# Patient Record
Sex: Female | Born: 1980 | Race: Black or African American | Hispanic: No | Marital: Single | Smoking: Never smoker
Health system: Southern US, Community
[De-identification: ages and names within clinical notes are randomized; demographics above are authoritative.]

## PROBLEM LIST (undated history)

## (undated) DIAGNOSIS — R000 Tachycardia, unspecified: Secondary | ICD-10-CM

## (undated) DIAGNOSIS — F1121 Opioid dependence, in remission: Secondary | ICD-10-CM

## (undated) DIAGNOSIS — F1120 Opioid dependence, uncomplicated: Secondary | ICD-10-CM

## (undated) DIAGNOSIS — F329 Major depressive disorder, single episode, unspecified: Secondary | ICD-10-CM

## (undated) DIAGNOSIS — R87629 Unspecified abnormal cytological findings in specimens from vagina: Secondary | ICD-10-CM

## (undated) DIAGNOSIS — F419 Anxiety disorder, unspecified: Secondary | ICD-10-CM

## (undated) DIAGNOSIS — L732 Hidradenitis suppurativa: Secondary | ICD-10-CM

## (undated) DIAGNOSIS — F32A Depression, unspecified: Secondary | ICD-10-CM

## (undated) DIAGNOSIS — D219 Benign neoplasm of connective and other soft tissue, unspecified: Secondary | ICD-10-CM

## (undated) HISTORY — DX: Hidradenitis suppurativa: L73.2

## (undated) HISTORY — PX: WISDOM TOOTH EXTRACTION: SHX21

## (undated) HISTORY — PX: HYDRADENITIS EXCISION: SHX5243

---

## 2000-07-21 ENCOUNTER — Other Ambulatory Visit: Admission: RE | Admit: 2000-07-21 | Discharge: 2000-07-21 | Payer: Self-pay | Admitting: Obstetrics and Gynecology

## 2000-10-22 ENCOUNTER — Other Ambulatory Visit: Admission: RE | Admit: 2000-10-22 | Discharge: 2000-10-22 | Payer: Self-pay | Admitting: Obstetrics and Gynecology

## 2000-11-23 ENCOUNTER — Inpatient Hospital Stay (HOSPITAL_COMMUNITY): Admission: AD | Admit: 2000-11-23 | Discharge: 2000-11-23 | Payer: Self-pay | Admitting: Obstetrics and Gynecology

## 2000-12-08 ENCOUNTER — Inpatient Hospital Stay (HOSPITAL_COMMUNITY): Admission: AD | Admit: 2000-12-08 | Discharge: 2000-12-10 | Payer: Self-pay | Admitting: Obstetrics and Gynecology

## 2003-03-10 ENCOUNTER — Other Ambulatory Visit: Admission: RE | Admit: 2003-03-10 | Discharge: 2003-03-10 | Payer: Self-pay | Admitting: Family Medicine

## 2007-04-13 ENCOUNTER — Emergency Department (HOSPITAL_COMMUNITY): Admission: EM | Admit: 2007-04-13 | Discharge: 2007-04-13 | Payer: Self-pay | Admitting: Emergency Medicine

## 2008-01-25 ENCOUNTER — Encounter (INDEPENDENT_AMBULATORY_CARE_PROVIDER_SITE_OTHER): Payer: Self-pay | Admitting: General Surgery

## 2008-01-25 ENCOUNTER — Ambulatory Visit (HOSPITAL_BASED_OUTPATIENT_CLINIC_OR_DEPARTMENT_OTHER): Admission: RE | Admit: 2008-01-25 | Discharge: 2008-01-25 | Payer: Self-pay | Admitting: General Surgery

## 2010-06-19 NOTE — Op Note (Signed)
NAMEYITTA, GONGAWARE              ACCOUNT NO.:  [id number]   MEDICAL RECORD NO.:  [id number]          PATIENT TYPE:  AMB   LOCATION:  [facility]                         FACILITY:  [facility]   PHYSICIAN:  Anselm Pancoast. Weatherly, M.D.DATE OF BIRTH:  Mar 24, 1980   DATE OF PROCEDURE:  01/25/2008  DATE OF DISCHARGE:                               OPERATIVE REPORT   PREOPERATIVE DIAGNOSES:  Chronic recurrent hidradenitis, right axilla.  History of previous wide excision of hidradenitis, left axilla.   HISTORY:  Kristen David is a 30 year old female that I saw in the  office in December.  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 Allyne Gee, M.D. gave  her my name. [name]. Shon Hough, M.D. in 2004 did a wide excision of  the hidradenitis in the left axilla.  Leonie Man, M.D. in our office  had done her mother's hidradenitis and Dr. Shon Hough had re-excised the  area, so it appears there is a significant family history of this.  She  is in school and elected at this time so that hopefully she can get this  done during the Christmas holidays and she has been on doxycycline  recently.  On examination now there is one area that is a chronic  draining sinus going down the inner aspect of the lateral right axilla  and then there is this chronic area of hidradenitis in the axilla, but  there is not any obvious drainage clinically at this time.   Preoperatively, I gave her a gram of Ancef, marked the right side and  then took her back to the operating room, positioned her so that the  right arm was at about 90 degrees and then placed a towel up under her  back to kind of elevate since it goes quite far laterally.  I then made  a skin mark to excise all of the various areas and then starting at the  superior aspect of the inferior flap, elevated the skin with skin hooks  and later switched to the double prong breast hook for exposure and this  was a large area of excision of the area.  Numerous vessels were clamped  and  tied with 4-0 Vicryl.  Good hemostasis was obtained and in the  lateral __________ to the arm, the incision was made by a little S-shape  to include that area which is the more active acute changes right at  this time.  All of the superior of the skin flap excised was probably an  inch and a half in width and undermining both superiorly and inferiorly.  As far as I put a 10 Jackson-Pratt drain, it was flat and then used a  few 4-0 Vicryl stitches subcuticular and then a few 3-0 nylon to suture  the drain also at the more tension areas of the incision and then used  simple 4-0 nylon and then used skin staples also.  The patient tolerated  the procedure nicely.  I did not put any local anesthesia in and I am  going to switch her to Septra.   I did culture the wounds on the back table, aerobic  and anaerobic and if  we need to make a switch of her antibiotics we will when the results  return.  The patient will be released after a short time.  Her mother  knows how to do the [place] drains, who she will be staying with.  I will  plan on seeing her in the office early next week, remove the actual  staples.  The actual skin stitches I am going to leave in about 10 days  or 2 weeks.           ______________________________  Anselm Pancoast. Zachery Dakins, M.D.    WJW/MEDQ  D:  01/25/2008  T:  01/25/2008  Job:  [id number]   cc:   Candyce Churn. Allyne Gee, M.D.  Fax: [phone]

## 2010-06-22 NOTE — H&P (Signed)
[facility]  Patient:    Kristen David, Kristen David Visit Number: [id number] MRN: [id number]          Service Type: OBS Location: 910B 9160 01 Attending Physician:  Leonard Schwartz Dictated by:   Saverio Danker, C.N.M. Admit Date:  12/08/2000                           History and Physical  CHIEF COMPLAINT:              Ms. Desch is a 30 year old single black female, gravida 2 para 0, 0-1-0, at 38-1/[redacted] weeks gestation, who presents complaining of uterine contractions every two to three minutes for the last several hours.  HISTORY OF PRESENT ILLNESS:   She denies any leaking or vaginal bleeding while at home.  She denies any headache or visual disturbances.  She does report some nausea but no vomiting until just now when she arrived in maternity admissions.  She does report positive fetal movement.  Her pregnancy has been followed at [facility] by the Certified Nurse Midwife service and has been essentially uncomplicated, though at risk for                               1. History of conception on oral contraceptives.                               2. First trimester spotting.                               3. Abnormal Pap smear.                                Her group B strep is negative.  She requests an epidural for labor.  PAST OB/GYN HISTORY:          She is gravida 2 para 0, 0-1-0, and had an elective AB in November 2001 without any complications.  She was taking Ortho Tri-Cyclen when she conceived but had missed some pills at that time.  ALLERGIES:                    No known drug allergies.  PAST MEDICAL HISTORY:         She reports having had the usual childhood diseases.  She has no medical problems.  PAST SURGICAL HISTORY:        Her only surgery was that her wisdom teeth were removed in November of 2000.  FAMILY HISTORY:               Surgery for multiple relatives with type 2 diabetes.  Maternal grandfather with  kidney failure secondary to diabetes. Maternal grandfather with prostate cancer, and maternal grandmother with stroke.  GENETIC HISTORY:              Negative.  SOCIAL HISTORY:               She is single.  She is employed part-time.  She lives with her mother, who is supportive.  The father of the baby is also supportive.  They deny any illicit drug use, alcohol or smoking with this pregnancy.  They are of the [employer] faith.  PRENATAL LABORATORY DATA:     Blood type B-positive.  Antibody screen negative.  Sickle cell trait negative.  Syphilis nonreactive.  Rubella immune. Hepatitis B surface antigen negative.  HIV nonreactive.  GC and Chlamydia both negative.  Maternal serum alpha fetoprotein was within normal limits.  One hour glucola was also within normal limits.  Her 36 week beta strep was negative.  PHYSICAL EXAMINATION:  VITAL SIGNS:                  Stable.  She is afebrile.  HEENT:                        Grossly within normal limits.  HEART:                        Regular rhythm and rate.  CHEST:                        Clear.  BREAST:                       Soft, nontender.  ABDOMEN:                      Gravid, with uterine contractions every two to three minutes.  Fetal heart rate reactive and reassuring.  PELVIC:                       Cervix 4 cm, 95% effaced, vertex at 0 station, with tight bulging membranes noted.  EXTREMITIES:                  Within normal limits.  ASSESSMENT:                   1. Intrauterine pregnancy at term.                               2. Active labor.                               3. Desires epidural for labor.                               4. Negative group B streptococci.  PLAN:                         1. Admit to labor and delivery to follow routine                                  CNM orders.                               2. Place an epidural.                               3. Notify Dr. Jaymes Graff of patients  admission. Dictated by:   Vance Gather Duplantis, C.N.M. Attending Physician:  Leonard Schwartz DD:  12/08/00 TD:  12/08/00 Job: [id number]

## 2010-10-29 LAB — CULTURE, ROUTINE-ABSCESS

## 2010-11-09 LAB — ANAEROBIC CULTURE

## 2010-11-09 LAB — WOUND CULTURE

## 2011-03-08 ENCOUNTER — Encounter (HOSPITAL_COMMUNITY): Payer: Self-pay | Admitting: *Deleted

## 2011-03-08 ENCOUNTER — Emergency Department (HOSPITAL_COMMUNITY)
Admission: EM | Admit: 2011-03-08 | Discharge: 2011-03-08 | Disposition: A | Discharge status: Home / Self Care | Payer: Self-pay | Attending: Emergency Medicine | Admitting: Emergency Medicine

## 2011-03-08 DIAGNOSIS — L02214 Cutaneous abscess of groin: Secondary | ICD-10-CM

## 2011-03-08 DIAGNOSIS — N764 Abscess of vulva: Secondary | ICD-10-CM | POA: Insufficient documentation

## 2011-03-08 DIAGNOSIS — L02219 Cutaneous abscess of trunk, unspecified: Secondary | ICD-10-CM | POA: Insufficient documentation

## 2011-03-08 MED ORDER — DOXYCYCLINE HYCLATE 100 MG PO CAPS: 100.0000 mg | ORAL_CAPSULE | Freq: Two times a day (BID) | ORAL | Status: AC

## 2011-03-08 MED ORDER — HYDROCODONE-ACETAMINOPHEN 5-325 MG PO TABS: ORAL_TABLET | ORAL | Status: AC

## 2011-03-08 NOTE — ED Notes (Signed)
Pt reports she has had cyst for several weeks. Pt reports this is a chronic issue. Pt reports she had an episode similar episodes under her arms, breasts, thighs and groin.  Pt has not been on antibiotics recently.

## 2011-03-08 NOTE — ED Provider Notes (Signed)
History     CSN: [id number]  Arrival date & time 03/08/11  1850   First MD Initiated Contact with Patient 03/08/11 1915      Chief Complaint  Patient presents with  . Cyst    (Consider location/radiation/quality/duration/timing/severity/associated sxs/prior treatment) HPI Comments: Patient with history of multiple recurrent MRSA boils -- presents with one week of recurrent boils in her left groin. Patient states some drainage from these areas. No fever. Patient has had previous surgery on bilateral axillas. Several of these areas have been draining. They are tender. Pain is not relieved with ibuprofen.  Patient is a 31 y.o. female presenting with abscess. The history is provided by the patient.  Abscess  This is a recurrent problem. The current episode started less than one week ago. The onset was gradual. The abscess is present on the groin. The problem is moderate. Pertinent negatives include no fever and no vomiting.    History reviewed. No pertinent past medical history.  History reviewed. No pertinent past surgical history.  No family history on file.  History  Substance Use Topics  . Smoking status: Never Smoker   . Smokeless tobacco: Not on file  . Alcohol Use: Yes     social drinking    OB History    Grav Para Term Preterm Abortions TAB SAB Ect Mult Living                  Review of Systems  Constitutional: Negative for fever.  Gastrointestinal: Negative for nausea and vomiting.  Skin: Negative for color change.       Positive for abscess.  Hematological: Negative for adenopathy.    Allergies  Review of patient's allergies indicates no known allergies.  Home Medications   Current Outpatient Rx  Name Route Sig Dispense Refill  . IBUPROFEN 200 MG PO TABS Oral Take 400 mg by mouth every 6 (six) hours as needed. PAIN      BP 139/83  Pulse 84  Temp(Src) 98.6 F (37 C) (Oral)  Resp 16  SpO2 100%  LMP 02/09/2011  Physical Exam  Nursing note and  vitals reviewed. Constitutional: She is oriented to person, place, and time. She appears well-developed and well-nourished.  HENT:  Head: Normocephalic and atraumatic.  Eyes: Conjunctivae are normal.  Neck: Normal range of motion. Neck supple.  Pulmonary/Chest: No respiratory distress.  Genitourinary:       Several small abscesses noted L groin and labia. One is actively draining and is associated with 1cm area of flucuence. Others are smaller, tender but are not associated with significant fluctuance. No signs of cellulitis.    Neurological: She is alert and oriented to person, place, and time.  Skin: Skin is warm and dry.  Psychiatric: She has a normal mood and affect.    ED Course  Procedures (including critical care time)  Labs Reviewed - No data to display No results found.   1. Abscess of groin    12:57 AM Patient seen and examined.   Vital signs reviewed and are as follows: Filed Vitals:   03/08/11 1919  BP: 139/83  Pulse: 84  Temp: 98.6 F (37 C)  Resp: 16   Patient examined with nurse chaperone at bedside.   Patient counseled on use of antibiotics and OTC treatments.   Urged to return with worsening, fever.    MDM  Patient with skin absceses, none which are amenable to incision and drainage at this time.  Will give abx. No signs of  cellulitis is surrounding skin.  Will d/c to home.          Eustace Moore [place], [place] 03/09/11 [phone]

## 2011-03-08 NOTE — ED Notes (Signed)
Pt has several small superficial cyst to inner thighs and groin area. Most are draining. No major areas of inflammation or infection noted.

## 2011-03-08 NOTE — ED Notes (Signed)
Pt reports she has chronic boils since she was a teenager. Pt has been taking bactrim for infections. The boil went away and then three weeks ago got another boil which has gotten larger and spread to more. Pt has boil to right thigh, pubic bone and also left labia.

## 2011-03-09 NOTE — ED Provider Notes (Signed)
Medical screening examination/treatment/procedure(s) were performed by non-physician practitioner and as supervising physician I was immediately available for consultation/collaboration. Jahmeer Porche Y.   Tenishia Ekman Y. Chevez Sambrano, MD 03/09/11 2259 

## 2011-12-02 ENCOUNTER — Encounter (HOSPITAL_COMMUNITY): Payer: Self-pay | Admitting: *Deleted

## 2011-12-02 ENCOUNTER — Emergency Department (HOSPITAL_COMMUNITY)
Admission: EM | Admit: 2011-12-02 | Discharge: 2011-12-02 | Disposition: A | Discharge status: Home / Self Care | Payer: Self-pay | Attending: Emergency Medicine | Admitting: Emergency Medicine

## 2011-12-02 DIAGNOSIS — L02419 Cutaneous abscess of limb, unspecified: Secondary | ICD-10-CM | POA: Insufficient documentation

## 2011-12-02 DIAGNOSIS — L732 Hidradenitis suppurativa: Secondary | ICD-10-CM | POA: Insufficient documentation

## 2011-12-02 DIAGNOSIS — L0291 Cutaneous abscess, unspecified: Secondary | ICD-10-CM

## 2011-12-02 MED ORDER — CEPHALEXIN 500 MG PO CAPS: 500.0000 mg | ORAL_CAPSULE | Freq: Four times a day (QID) | ORAL | Status: DC

## 2011-12-02 MED ORDER — OXYCODONE-ACETAMINOPHEN 5-325 MG PO TABS: 1.0000 | ORAL_TABLET | Freq: Four times a day (QID) | ORAL | Status: DC | PRN

## 2011-12-02 MED ORDER — SULFAMETHOXAZOLE-TRIMETHOPRIM 800-160 MG PO TABS: 1.0000 | ORAL_TABLET | Freq: Two times a day (BID) | ORAL | Status: DC

## 2011-12-02 MED ORDER — KETOROLAC TROMETHAMINE 60 MG/2ML IM SOLN
60.0000 mg | Freq: Once | INTRAMUSCULAR | Status: AC
  Administered 2011-12-02: 60 mg via INTRAMUSCULAR
  Filled 2011-12-02: qty 2

## 2011-12-02 NOTE — ED Notes (Signed)
Pt present with a boil that been reoccurring for months.  Right inner thigh.  Pt reports minmal drainage.  Pt report soiling clothes with ruptures.  Pt denies n/v chills or fever.

## 2011-12-02 NOTE — ED Notes (Signed)
Rx given x3 Pt ambulating independently w/ steady gait on d/c in no acute distress, A&Ox4. D/c instructions reviewed w/ pt - pt denies any further questions or concerns at present.   

## 2011-12-02 NOTE — ED Provider Notes (Signed)
History     CSN: [id number]  Arrival date & time 12/02/11  2123   First MD Initiated Contact with Patient 12/02/11 2128      Chief Complaint  Patient presents with  . Recurrent Skin Infections    (Consider location/radiation/quality/duration/timing/severity/associated sxs/prior treatment) HPI Comments: Patient with history of hidradenitis and bilateral axillary surgery presents with complaint of abscess. Patietn has had numerous abscesses. She states that this abscess in on her right inner thigh and extremely painful. She rates the pain 10/10 and worse with wearing underwear. She has used warm compresses and has elicited some pus, but states the area will not heal. Denies fever or chills.   The history is provided by the patient. No language interpreter was used.    No past medical history on file.  No past surgical history on file.  No family history on file.  History  Substance Use Topics  . Smoking status: Never Smoker   . Smokeless tobacco: Not on file  . Alcohol Use: Yes     social drinking    OB History    Grav Para Term Preterm Abortions TAB SAB Ect Mult Living                  Review of Systems  Constitutional: Negative for fever and chills.  Skin: Positive for wound.    Allergies  Review of patient's allergies indicates no known allergies.  Home Medications   Current Outpatient Rx  Name Route Sig Dispense Refill  . IBUPROFEN 200 MG PO TABS Oral Take 400 mg by mouth every 6 (six) hours as needed. PAIN      BP 123/65  Pulse 90  Temp 98.2 F (36.8 C) (Oral)  Resp 14  SpO2 100%  Physical Exam  Nursing note and vitals reviewed. Constitutional: She appears well-developed and well-nourished. No distress.  HENT:  Head: Normocephalic and atraumatic.  Mouth/Throat: Oropharynx is clear and moist.  Eyes: Conjunctivae normal are normal. No scleral icterus.  Neck: Normal range of motion. Neck supple.  Cardiovascular: Normal rate, regular rhythm and  normal heart sounds.   Pulmonary/Chest: Effort normal and breath sounds normal.  Abdominal: Soft. Bowel sounds are normal. There is no tenderness.  Neurological: She is alert.  Skin: Skin is warm and dry.       ED Course  Procedures (including critical care time)  Labs Reviewed - No data to display No results found.   1. Abscess   2. Hidradenitis suppurativa       MDM  Patient presented with complaint of abscess. Area open but no fluctuance or residual pus that requires I & D. Patient given Toradol in ED with improvement. Discharged on bactrim, keflex, and given referral to general surgery. Return precautions given.        Pixie Casino, PA-C 12/03/11 0014

## 2011-12-03 NOTE — ED Provider Notes (Signed)
Medical screening examination/treatment/procedure(s) were performed by non-physician practitioner and as supervising physician I was immediately available for consultation/collaboration.   Loren Racer, MD 12/03/11 1505

## 2012-05-21 ENCOUNTER — Encounter (INDEPENDENT_AMBULATORY_CARE_PROVIDER_SITE_OTHER): Payer: Self-pay | Admitting: General Surgery

## 2012-05-21 ENCOUNTER — Ambulatory Visit (INDEPENDENT_AMBULATORY_CARE_PROVIDER_SITE_OTHER): Payer: Medicaid Other | Admitting: General Surgery

## 2012-05-21 VITALS — BP 122/78 | HR 88 | Temp 98.3°F | Resp 20 | Ht 60.0 in | Wt 169.0 lb

## 2012-05-21 DIAGNOSIS — L732 Hidradenitis suppurativa: Secondary | ICD-10-CM

## 2012-05-21 NOTE — Patient Instructions (Signed)
Clean area with Hibiclens soap daily. Applied dry dressing to area daily. Try not to touch the area with your hands.

## 2012-05-21 NOTE — Progress Notes (Signed)
Patient ID: Kristen David, female   DOB: December 24, 1980, 33 y.o.   MRN: [id number]  Chief Complaint  Patient presents with  . Follow-up    L groin abscess    HPI Kristen David is a 32 y.o. female.   HPI  She is referred by Luster Landsberg, ANP for evaluation of a left groin abscess.  She has a long history of hidradenitis suppurativa. She's had procedures done and the axillary areas. She developed an area of drainage in her left groin and there is concern that she may need an incision and drainage. She was started on doxycycline 2 days ago.  History reviewed. No pertinent past medical history.  History reviewed. No pertinent past surgical history.  Family History  Problem Relation Age of Onset  . Diabetes Mother   . Cancer Mother     Breast    Social History History  Substance Use Topics  . Smoking status: Never Smoker   . Smokeless tobacco: Never Used  . Alcohol Use: Yes     Comment: social drinking    No Known Allergies  Current Outpatient Prescriptions  Medication Sig Dispense Refill  . doxycycline (DORYX) 100 MG EC tablet Take 100 mg by mouth 2 (two) times daily.       No current facility-administered medications for this visit.    Review of Systems Review of Systems  Constitutional: Negative for fever and chills.    Blood pressure 122/78, pulse 88, temperature 98.3 F (36.8 C), temperature source Temporal, resp. rate 20, height 5' (1.524 m), weight 169 lb (76.658 kg).  Physical Exam Physical Exam  Constitutional: No distress.  Overweight female.  Genitourinary:  1 cm open wound in the left groin. No purulence or significant erythema. Some shotty adenopathy proximal to this. Multiple scars in both groins.    Data Reviewed Notes from [facility]  Assessment    Left groin hidradenitis suppurativa. No abscess that needs to be drained.     Plan    Clean area with Hibiclens soap daily. Applied dry dressing to the area. Continue  antibiotics. Return visit as needed.        Lolamae Voisin J 05/21/2012, 5:10 PM

## 2012-05-29 ENCOUNTER — Encounter (INDEPENDENT_AMBULATORY_CARE_PROVIDER_SITE_OTHER): Payer: Self-pay

## 2012-06-15 ENCOUNTER — Telehealth: Payer: Self-pay | Admitting: *Deleted

## 2012-06-15 ENCOUNTER — Ambulatory Visit: Payer: Self-pay | Admitting: Internal Medicine

## 2012-06-15 NOTE — Telephone Encounter (Signed)
Pt did not come for referral appt.  Left message requesting pt call [facility] for a new referral appt.

## 2012-09-17 ENCOUNTER — Emergency Department (HOSPITAL_COMMUNITY)
Admission: EM | Admit: 2012-09-17 | Discharge: 2012-09-17 | Disposition: A | Discharge status: Home / Self Care | Payer: Medicaid Other | Attending: Emergency Medicine | Admitting: Emergency Medicine

## 2012-09-17 ENCOUNTER — Encounter (HOSPITAL_COMMUNITY): Payer: Self-pay | Admitting: Emergency Medicine

## 2012-09-17 DIAGNOSIS — Z79899 Other long term (current) drug therapy: Secondary | ICD-10-CM | POA: Insufficient documentation

## 2012-09-17 DIAGNOSIS — L732 Hidradenitis suppurativa: Secondary | ICD-10-CM | POA: Insufficient documentation

## 2012-09-17 DIAGNOSIS — Z8614 Personal history of Methicillin resistant Staphylococcus aureus infection: Secondary | ICD-10-CM | POA: Insufficient documentation

## 2012-09-17 DIAGNOSIS — L539 Erythematous condition, unspecified: Secondary | ICD-10-CM | POA: Insufficient documentation

## 2012-09-17 MED ORDER — SULFAMETHOXAZOLE-TRIMETHOPRIM 800-160 MG PO TABS: 1.0000 | ORAL_TABLET | Freq: Two times a day (BID) | ORAL | Status: AC

## 2012-09-17 MED ORDER — CEPHALEXIN 500 MG PO CAPS: 500.0000 mg | ORAL_CAPSULE | Freq: Four times a day (QID) | ORAL | Status: DC

## 2012-09-17 MED ORDER — HYDROCODONE-ACETAMINOPHEN 5-325 MG PO TABS: 1.0000 | ORAL_TABLET | ORAL | Status: DC | PRN

## 2012-09-17 MED ORDER — IBUPROFEN 800 MG PO TABS
800.0000 mg | ORAL_TABLET | Freq: Once | ORAL | Status: AC
  Administered 2012-09-17: 800 mg via ORAL
  Filled 2012-09-17: qty 1

## 2012-09-17 NOTE — ED Notes (Signed)
Pt c/o abscesses on buttocks and thighs x2 weeks that have ruptured.  Reports hx of abscesses.

## 2012-09-17 NOTE — ED Provider Notes (Signed)
CSN: [id number]     Arrival date & time 09/17/12  1118 History     First MD Initiated Contact with Patient 09/17/12 1129     Chief Complaint  Patient presents with  . Abscess   (Consider location/radiation/quality/duration/timing/severity/associated sxs/prior Treatment) HPI Comments: Patient is a 32 year old female with a history of hidradenitis suppurativa who presents for abscesses to her right and left inguinal region. Abscesses have been present for 2 weeks and have been associated with pain. Pain worse x 2 days as patient states abscesses ruptured with use of frequent hot compresses and warm soaks. Pain aggravated with palpation. She states abscesses have all been actively draining x 2 days. Patient denies fevers, red linear streaking, numbness/tingling, dysuria, and rectal pain or pain with defecation. She is followed by [facility] and has hand operations on both axilla, but was told she is not a candidate for additional procedures at this time. PCP - Dr. Allyne Gee. Endorses hx of MRSA.  Patient is a 32 y.o. female presenting with abscess. The history is provided by the patient. No language interpreter was used.  Abscess Associated symptoms: no fever     Past Medical History  Diagnosis Date  . Hidradenitis suppurativa    History reviewed. No pertinent past surgical history. Family History  Problem Relation Age of Onset  . Diabetes Mother   . Cancer Mother     Breast   History  Substance Use Topics  . Smoking status: Never Smoker   . Smokeless tobacco: Never Used  . Alcohol Use: Yes     Comment: social drinking   OB History   Grav Para Term Preterm Abortions TAB SAB Ect Mult Living                 Review of Systems  Constitutional: Negative for fever.  Gastrointestinal: Negative for rectal pain.  Genitourinary: Negative for dysuria and vaginal pain.  Skin: Positive for color change.       +abscesses  Neurological: Negative for weakness and numbness.    All other systems reviewed and are negative.   Allergies  Review of patient's allergies indicates no known allergies.  Home Medications   Current Outpatient Rx  Name  Route  Sig  Dispense  Refill  . citalopram (CELEXA) 20 MG tablet   Oral   Take 20 mg by mouth daily.         Marland Kitchen ibuprofen (ADVIL,MOTRIN) 200 MG tablet   Oral   Take 400 mg by mouth every 6 (six) hours as needed for pain.         . Multiple Vitamin (MULTIVITAMIN WITH MINERALS) TABS tablet   Oral   Take 1 tablet by mouth daily.         . traZODone (DESYREL) 50 MG tablet   Oral   Take 50 mg by mouth at bedtime.         . Vitamins A & D (VITAMIN A & D) ointment   Topical   Apply 1 application topically daily as needed for dry skin.         . cephALEXin (KEFLEX) 500 MG capsule   Oral   Take 1 capsule (500 mg total) by mouth 4 (four) times daily.   40 capsule   0   . HYDROcodone-acetaminophen (NORCO/VICODIN) 5-325 MG per tablet   Oral   Take 1 tablet by mouth every 4 (four) hours as needed for pain.   18 tablet   0   . sulfamethoxazole-trimethoprim (BACTRIM  DS,SEPTRA DS) 800-160 MG per tablet   Oral   Take 1 tablet by mouth 2 (two) times daily.   20 tablet   0    BP 116/78  Pulse 105  Temp(Src) 98.1 F (36.7 C)  SpO2 99% Physical Exam  Nursing note and vitals reviewed. Constitutional: She is oriented to person, place, and time. She appears well-developed and well-nourished. No distress.  HENT:  Head: Normocephalic and atraumatic.  Eyes: Conjunctivae and EOM are normal. No scleral icterus.  Neck: Normal range of motion.  Cardiovascular: Normal rate, regular rhythm and intact distal pulses.   Distal pulses 2+ b/l  Pulmonary/Chest: Effort normal. No respiratory distress.  Musculoskeletal: Normal range of motion.       Legs: Neurological: She is alert and oriented to person, place, and time.  Skin: Skin is warm and dry. No rash noted. She is not diaphoretic. There is erythema. No  pallor.  Patient with 4 abscesses in inguinal region; 2 on left side and 2 on right side. Areas fluctuant and exquisitely TTP; mild induration in L inguinal region. No active drainage appreciated. No red linear streaking.  Psychiatric: She has a normal mood and affect. Her behavior is normal.   ED Course   Procedures (including critical care time)  Labs Reviewed - No data to display No results found.  1. Hidradenitis suppurativa-groin    MDM  4 uncomplicated abscesses in patient with hx of MRSA and hidradenitis suppurativa. Patient endorses that abscesses actively draining x 2 days. Presents, primarily for pain control, as pain has worsened since abscesses ruptured. Patient neurovascularly intact. No complicating features, fever, or red linear streaking. U/S done at bedside significant for superficial abscesses of R and L groin. Have discussed drainage at bedside in ED which she declines at this time; reliable for follow up with general surgery and her PCP. Patient appropriate for d/c with Rx for Bactrim and Keflex as well as Norco for pain control. Indications for ED return discussed and patient agreeable to plan.        Antony Madura, PA-C 09/17/12 1222  Antony Madura, PA-C 09/18/12 2155

## 2012-09-19 NOTE — ED Provider Notes (Signed)
Medical screening examination/treatment/procedure(s) were conducted as a shared visit with non-physician practitioner(s) and myself.  I personally evaluated the patient during the encounter.  Pt with hidradenitis suppurativa. Self draining.   Derwood Kaplan, MD 09/19/12 [phone]

## 2013-05-17 ENCOUNTER — Emergency Department (HOSPITAL_COMMUNITY)
Admission: EM | Admit: 2013-05-17 | Discharge: 2013-05-17 | Disposition: A | Discharge status: Home / Self Care | Payer: Medicaid Other | Attending: Emergency Medicine | Admitting: Emergency Medicine

## 2013-05-17 ENCOUNTER — Encounter (HOSPITAL_COMMUNITY): Payer: Self-pay | Admitting: Emergency Medicine

## 2013-05-17 DIAGNOSIS — L02219 Cutaneous abscess of trunk, unspecified: Secondary | ICD-10-CM | POA: Insufficient documentation

## 2013-05-17 DIAGNOSIS — Z79899 Other long term (current) drug therapy: Secondary | ICD-10-CM | POA: Insufficient documentation

## 2013-05-17 DIAGNOSIS — L03319 Cellulitis of trunk, unspecified: Secondary | ICD-10-CM

## 2013-05-17 DIAGNOSIS — Z792 Long term (current) use of antibiotics: Secondary | ICD-10-CM | POA: Insufficient documentation

## 2013-05-17 DIAGNOSIS — L732 Hidradenitis suppurativa: Secondary | ICD-10-CM | POA: Insufficient documentation

## 2013-05-17 DIAGNOSIS — L02214 Cutaneous abscess of groin: Secondary | ICD-10-CM

## 2013-05-17 MED ORDER — HYDROCODONE-ACETAMINOPHEN 5-325 MG PO TABS: 1.0000 | ORAL_TABLET | ORAL | Status: DC | PRN

## 2013-05-17 MED ORDER — SULFAMETHOXAZOLE-TRIMETHOPRIM 800-160 MG PO TABS: 1.0000 | ORAL_TABLET | Freq: Two times a day (BID) | ORAL | Status: AC

## 2013-05-17 MED ORDER — CEPHALEXIN 500 MG PO CAPS: 500.0000 mg | ORAL_CAPSULE | Freq: Four times a day (QID) | ORAL | Status: DC

## 2013-05-17 NOTE — ED Provider Notes (Signed)
CSN: [id number]     Arrival date & time 05/17/13  2153 History   None    This chart was scribed for non-physician practitioner, Antonietta Breach, PA-C working with Merryl Hacker, MD by Forrestine Him, ED Scribe. This patient was seen in room WTR6/WTR6 and the patient's care was started at 11:13 PM.   Chief Complaint  Patient presents with  . Recurrent Skin Infections   HPI  HPI Comments: Kristen David is a 33 y.o. Female with a PMHx of hidradenitis suppurativa who presents to the Emergency Department complaining of an abscess to R groin x 3 weeks. Pt states she has tried heat and soaks to the area with mild improvement. States the area opened about 1 week ago, however, she is still experiencing worsening pain to the area. Pt states she has experienced multiple abscesses and says they always appear in the same region. States she has consulted with a Psychologist, sport and exercise at [place] but was advised to not move forward with surgery for her skin infection. At this time she denies any fever, pain with bowel movement, vaginal pain, vaginal discharge, hematuria, or chills. Pt states these infections have resolved with antibiotics in the past. No other concerns this visit.   Past Medical History  Diagnosis Date  . Hidradenitis suppurativa    History reviewed. No pertinent past surgical history. Family History  Problem Relation Age of Onset  . Diabetes Mother   . Cancer Mother     Breast   History  Substance Use Topics  . Smoking status: Never Smoker   . Smokeless tobacco: Never Used  . Alcohol Use: Yes     Comment: social drinking   OB History   Grav Para Term Preterm Abortions TAB SAB Ect Mult Living                  Review of Systems  Constitutional: Negative for fever and chills.  HENT: Negative for congestion.   Eyes: Negative for redness.  Respiratory: Negative for cough.   Genitourinary: Negative for hematuria, vaginal discharge and vaginal pain.  Skin: Positive for wound  (Abscess to R goin). Negative for rash.  Psychiatric/Behavioral: Negative for confusion.     Allergies  Review of patient's allergies indicates no known allergies.  Home Medications   Current Outpatient Rx  Name  Route  Sig  Dispense  Refill  . cephALEXin (KEFLEX) 500 MG capsule   Oral   Take 1 capsule (500 mg total) by mouth 4 (four) times daily.   40 capsule   0   . citalopram (CELEXA) 20 MG tablet   Oral   Take 20 mg by mouth daily.         Marland Kitchen HYDROcodone-acetaminophen (NORCO/VICODIN) 5-325 MG per tablet   Oral   Take 1 tablet by mouth every 4 (four) hours as needed.   15 tablet   0   . ibuprofen (ADVIL,MOTRIN) 200 MG tablet   Oral   Take 400 mg by mouth every 6 (six) hours as needed for pain.         . Multiple Vitamin (MULTIVITAMIN WITH MINERALS) TABS tablet   Oral   Take 1 tablet by mouth daily.         Marland Kitchen sulfamethoxazole-trimethoprim (BACTRIM DS,SEPTRA DS) 800-160 MG per tablet   Oral   Take 1 tablet by mouth 2 (two) times daily.   20 tablet   0   . traZODone (DESYREL) 50 MG tablet   Oral   Take  50 mg by mouth at bedtime.         . Vitamins A & D (VITAMIN A & D) ointment   Topical   Apply 1 application topically daily as needed for dry skin.          Triage Vitals: BP 127/73  Pulse 79  Temp(Src) 98.5 F (36.9 C) (Oral)  Resp 14  SpO2 98%  LMP 05/04/2013   Physical Exam  Nursing note and vitals reviewed. Constitutional: She is oriented to person, place, and time. She appears well-developed and well-nourished.  HENT:  Head: Normocephalic and atraumatic.  Eyes: EOM are normal.  Neck: Normal range of motion.  Cardiovascular: Normal rate, regular rhythm and intact distal pulses.   Distal radial pulse 2+ in left upper extremity  Pulmonary/Chest: Effort normal.  Abdominal: Soft. There is no tenderness. There is no rebound and no guarding.  Genitourinary:    There is tenderness on the right labia. There is no rash on the right labia.  There is no rash, tenderness or lesion on the left labia. No vaginal discharge found.  Musculoskeletal: Normal range of motion.  Neurological: She is alert and oriented to person, place, and time.  Skin: Skin is warm and dry.  Psychiatric: She has a normal mood and affect. Her behavior is normal.    ED Course  Procedures (including critical care time)  DIAGNOSTIC STUDIES: Oxygen Saturation is 98% on RA, Normal by my interpretation.    COORDINATION OF CARE: 11:23 PM- Will prescribe antibiotics at discharge. Advised pt to return in 48 hours for a recheck. Discussed treatment plan with pt at bedside and pt agreed to plan.     Labs Review Labs Reviewed - No data to display Imaging Review No results found.   EKG Interpretation None      MDM   Final diagnoses:  Hidradenitis suppurativa  Abscess of groin, right    Patient with skin abscess to R groin c/w hidradenitis suppurativa. Patient declines I&D in ED as she states this is a recurring problem and the pain from trying to drain the area is "worse than childbirth".  Have stressed wound recheck in 2 days and f/u with her general surgeon. Encouraged home warm soaks and compresses. Mild signs of cellulitis is surrounding skin. Will d/c to home with Bactrim and Keflex. Return precautions discussed and patient agreeable to plan with no unaddressed concerns.  I personally performed the services described in this documentation, which was scribed in my presence. The recorded information has been reviewed and is accurate.   Filed Vitals:   05/17/13 2210  BP: 127/73  Pulse: 79  Temp: 98.5 F (36.9 C)  TempSrc: Oral  Resp: 14  SpO2: 98%     Antonietta Breach, PA-C 05/18/13 0012

## 2013-05-17 NOTE — ED Notes (Signed)
Patient reports that she has abscess in her right groin.

## 2013-05-17 NOTE — Discharge Instructions (Signed)
Abscess An abscess (boil or furuncle) is an infected area on or under the skin. This area is filled with yellowish-white fluid (pus) and other material (debris). HOME CARE   Only take medicines as told by your doctor.  If you were given antibiotic medicine, take it as directed. Finish the medicine even if you start to feel better.  If gauze is used, follow your doctor's directions for changing the gauze.  To avoid spreading the infection:  Keep your abscess covered with a bandage.  Wash your hands well.  Do not share personal care items, towels, or whirlpools with others.  Avoid skin contact with others.  Keep your skin and clothes clean around the abscess.  Keep all doctor visits as told. GET HELP RIGHT AWAY IF:   You have more pain, puffiness (swelling), or redness in the wound site.  You have more fluid or blood coming from the wound site.  You have muscle aches, chills, or you feel sick.  You have a fever. MAKE SURE YOU:   Understand these instructions.  Will watch your condition.  Will get help right away if you are not doing well or get worse. Document Released: 07/10/2007 Document Revised: 07/23/2011 Document Reviewed: 04/05/2011 [facility] Patient Information 2014 [place].  Abscess Care After An abscess (also called a boil or furuncle) is an infected area that contains a collection of pus. Signs and symptoms of an abscess include pain, tenderness, redness, or hardness, or you may feel a moveable soft area under your skin. An abscess can occur anywhere in the body. The infection may spread to surrounding tissues causing cellulitis. A cut (incision) by the surgeon was made over your abscess and the pus was drained out. Gauze may have been packed into the space to provide a drain that will allow the cavity to heal from the inside outwards. The boil may be painful for 5 to 7 days. Most people with a boil do not have high fevers. Your abscess, if seen early, may  not have localized, and may not have been lanced. If not, another appointment may be required for this if it does not get better on its own or with medications. HOME CARE INSTRUCTIONS   Only take over-the-counter or prescription medicines for pain, discomfort, or fever as directed by your caregiver.  When you bathe, soak and then remove gauze or iodoform packs at least daily or as directed by your caregiver. You may then wash the wound gently with mild soapy water. Repack with gauze or do as your caregiver directs. SEEK IMMEDIATE MEDICAL CARE IF:   You develop increased pain, swelling, redness, drainage, or bleeding in the wound site.  You develop signs of generalized infection including muscle aches, chills, fever, or a general ill feeling.  An oral temperature above 102 F (38.9 C) develops, not controlled by medication. See your caregiver for a recheck if you develop any of the symptoms described above. If medications (antibiotics) were prescribed, take them as directed. Document Released: 08/09/2004 Document Revised: 04/15/2011 Document Reviewed: 04/06/2007 [facility] Patient Information 2014 [place].

## 2013-05-19 NOTE — ED Provider Notes (Signed)
Medical screening examination/treatment/procedure(s) were performed by non-physician practitioner and as supervising physician I was immediately available for consultation/collaboration.   EKG Interpretation None        Merryl Hacker, MD 05/19/13 [phone]

## 2013-12-31 ENCOUNTER — Encounter (HOSPITAL_COMMUNITY): Payer: Self-pay | Admitting: Emergency Medicine

## 2013-12-31 ENCOUNTER — Emergency Department (HOSPITAL_COMMUNITY)
Admission: EM | Admit: 2013-12-31 | Discharge: 2013-12-31 | Disposition: A | Discharge status: Home / Self Care | Payer: Medicaid Other | Attending: Emergency Medicine | Admitting: Emergency Medicine

## 2013-12-31 DIAGNOSIS — L0231 Cutaneous abscess of buttock: Secondary | ICD-10-CM | POA: Insufficient documentation

## 2013-12-31 DIAGNOSIS — Z79899 Other long term (current) drug therapy: Secondary | ICD-10-CM | POA: Insufficient documentation

## 2013-12-31 DIAGNOSIS — L0291 Cutaneous abscess, unspecified: Secondary | ICD-10-CM

## 2013-12-31 MED ORDER — LIDOCAINE-EPINEPHRINE 2 %-1:100000 IJ SOLN: 20.0000 mL | Freq: Once | INTRAMUSCULAR | Status: DC

## 2013-12-31 MED ORDER — HYDROCODONE-ACETAMINOPHEN 5-325 MG PO TABS
1.0000 | ORAL_TABLET | Freq: Once | ORAL | Status: AC
  Administered 2013-12-31: 1 via ORAL
  Filled 2013-12-31: qty 1

## 2013-12-31 MED ORDER — HYDROCODONE-ACETAMINOPHEN 5-325 MG PO TABS: ORAL_TABLET | ORAL | Status: DC

## 2013-12-31 MED ORDER — LIDOCAINE-EPINEPHRINE (PF) 2 %-1:200000 IJ SOLN
10.0000 mL | Freq: Once | INTRAMUSCULAR | Status: AC
  Administered 2013-12-31: 10 mL via INTRADERMAL

## 2013-12-31 NOTE — ED Notes (Signed)
Pt reports boil on R lower buttock. Hx of having abscesses lanced before, feels like same.

## 2013-12-31 NOTE — ED Provider Notes (Signed)
CSN: [id number]     Arrival date & time 12/31/13  1052 History   First MD Initiated Contact with Patient 12/31/13 1136     Chief Complaint  Patient presents with  . Abscess     (Consider location/radiation/quality/duration/timing/severity/associated sxs/prior Treatment) HPI  Kristen David is a 33 y.o. female complaining of abscess to right gluteal area worsening over the course of a week. Patient's had multiple similar prior episodes. She has a history of hidradenitis Cipro T5. Her axillary abscesses were treated surgically with good result. She is seen the surgeon with respect to the perineal abscesses and he declines surgical treatment at this time. Pain is severe, 8 out of 10, exacerbated by sitting, movement, palpation. She denies discharge, no pain medication taken prior to arrival.  Past Medical History  Diagnosis Date  . Hidradenitis suppurativa    History reviewed. No pertinent past surgical history. Family History  Problem Relation Age of Onset  . Diabetes Mother   . Cancer Mother     Breast   History  Substance Use Topics  . Smoking status: Never Smoker   . Smokeless tobacco: Never Used  . Alcohol Use: Yes     Comment: social drinking   OB History    No data available     Review of Systems  10 systems reviewed and found to be negative, except as noted in the HPI.   Allergies  Review of patient's allergies indicates no known allergies.  Home Medications   Prior to Admission medications   Medication Sig Start Date End Date Taking? Authorizing Provider  citalopram (CELEXA) 20 MG tablet Take 20 mg by mouth daily.   Yes Historical Provider, MD  traZODone (DESYREL) 50 MG tablet Take 50 mg by mouth at bedtime.   Yes Historical Provider, MD  HYDROcodone-acetaminophen (NORCO/VICODIN) 5-325 MG per tablet Take 1-2 tablets by mouth every 6 hours as needed for pain. 12/31/13   Canda Podgorski, PA-C  ibuprofen (ADVIL,MOTRIN) 200 MG tablet Take 400 mg by mouth  every 6 (six) hours as needed for pain.    Historical Provider, MD   BP 118/76 mmHg  Pulse 85  Temp(Src) 98.4 F (36.9 C) (Oral)  Resp 16  SpO2 100% Physical Exam  Constitutional: She is oriented to person, place, and time. She appears well-developed and well-nourished. No distress.  HENT:  Head: Normocephalic.  Eyes: Conjunctivae and EOM are normal.  Cardiovascular: Normal rate.   Pulmonary/Chest: Effort normal. No stridor.  Genitourinary:     Musculoskeletal: Normal range of motion.  Neurological: She is alert and oriented to person, place, and time.  Psychiatric: She has a normal mood and affect.  Nursing note and vitals reviewed.   ED Course  INCISION AND DRAINAGE Date/Time: 12/31/2013 12:31 PM Performed by: Monico Blitz Authorized by: Monico Blitz Consent: Verbal consent obtained. Consent given by: patient Type: abscess Body area: anogenital Local anesthetic: co-phenylcaine spray Patient sedated: no Scalpel size: 11 Incision type: elliptical Complexity: complex Drainage: purulent Drainage amount: copious Wound treatment: wound left open Packing material: none Patient tolerance: Patient tolerated the procedure well with no immediate complications   (including critical care time) Labs Review Labs Reviewed - No data to display  Imaging Review No results found.   EKG Interpretation None      MDM   Final diagnoses:  Abscess    Filed Vitals:   12/31/13 1131  BP: 118/76  Pulse: 85  Temp: 98.4 F (36.9 C)  TempSrc: Oral  Resp: 16  SpO2:  100%    Medications  lidocaine-EPINEPHrine (XYLOCAINE W/EPI) 2 %-1:100000 (with pres) injection 20 mL (not administered)  lidocaine-EPINEPHrine (XYLOCAINE W/EPI) 2 %-1:200000 (PF) injection 10 mL (10 mLs Intradermal Given by Other 12/31/13 1159)  HYDROcodone-acetaminophen (NORCO/VICODIN) 5-325 MG per tablet 1 tablet (1 tablet Oral Given 12/31/13 1225)    Kristen David is a 33 y.o. female  presenting with right gluteal abscess. It is not perianal. Patient has no signs of systemic infection. I and D performed with expression of a significant amount of purulent material. Advised patient on wound care, sitz baths. No surrounding cellulitis. No indication for systemic antibiotics.  Evaluation does not show pathology that would require ongoing emergent intervention or inpatient treatment. Pt is hemodynamically stable and mentating appropriately. Discussed findings and plan with patient/guardian, who agrees with care plan. All questions answered. Return precautions discussed and outpatient follow up given.   Discharge Medication List as of 12/31/2013 12:07 PM           Monico Blitz, PA-C 12/31/13 1233  Monico Blitz, PA-C 12/31/13 1233  Charlesetta Shanks, MD 12/31/13 1610

## 2013-12-31 NOTE — Discharge Instructions (Signed)
Take vicodin for breakthrough pain, do not drink alcohol, drive, care for children or do other critical tasks while taking vicodin.   If you develop fever, have vomiting or if the swelling and redness starts spreading , return to the emergency room immediately for a recheck.  Please follow with your primary care doctor in the next 2 days for a check-up. They must obtain records for further management.   Do not hesitate to return to the Emergency Department for any new, worsening or concerning symptoms.   Abscess Care After An abscess (also called a boil or furuncle) is an infected area that contains a collection of pus. Signs and symptoms of an abscess include pain, tenderness, redness, or hardness, or you may feel a moveable soft area under your skin. An abscess can occur anywhere in the body. The infection may spread to surrounding tissues causing cellulitis. A cut (incision) by the surgeon was made over your abscess and the pus was drained out. Gauze may have been packed into the space to provide a drain that will allow the cavity to heal from the inside outwards. The boil may be painful for 5 to 7 days. Most people with a boil do not have high fevers. Your abscess, if seen early, may not have localized, and may not have been lanced. If not, another appointment may be required for this if it does not get better on its own or with medications. HOME CARE INSTRUCTIONS   Only take over-the-counter or prescription medicines for pain, discomfort, or fever as directed by your caregiver.  When you bathe, soak and then remove gauze or iodoform packs at least daily or as directed by your caregiver. You may then wash the wound gently with mild soapy water. Repack with gauze or do as your caregiver directs. SEEK IMMEDIATE MEDICAL CARE IF:   You develop increased pain, swelling, redness, drainage, or bleeding in the wound site.  You develop signs of generalized infection including muscle aches, chills,  fever, or a general ill feeling.  An oral temperature above 102 F (38.9 C) develops, not controlled by medication. See your caregiver for a recheck if you develop any of the symptoms described above. If medications (antibiotics) were prescribed, take them as directed. Document Released: 08/09/2004 Document Revised: 04/15/2011 Document Reviewed: 04/06/2007 [facility] Patient Information 2015 [place], [place]. This information is not intended to replace advice given to you by your health care provider. Make sure you discuss any questions you have with your health care provider.

## 2014-01-26 ENCOUNTER — Emergency Department (HOSPITAL_COMMUNITY): Payer: Medicaid Other

## 2014-01-26 ENCOUNTER — Emergency Department (HOSPITAL_COMMUNITY)
Admission: EM | Admit: 2014-01-26 | Discharge: 2014-01-26 | Disposition: A | Discharge status: Home / Self Care | Payer: Medicaid Other | Attending: Emergency Medicine | Admitting: Emergency Medicine

## 2014-01-26 ENCOUNTER — Encounter (HOSPITAL_COMMUNITY): Payer: Self-pay | Admitting: Emergency Medicine

## 2014-01-26 DIAGNOSIS — S99911A Unspecified injury of right ankle, initial encounter: Secondary | ICD-10-CM | POA: Diagnosis present

## 2014-01-26 DIAGNOSIS — R52 Pain, unspecified: Secondary | ICD-10-CM

## 2014-01-26 DIAGNOSIS — Z872 Personal history of diseases of the skin and subcutaneous tissue: Secondary | ICD-10-CM | POA: Insufficient documentation

## 2014-01-26 DIAGNOSIS — W010XXA Fall on same level from slipping, tripping and stumbling without subsequent striking against object, initial encounter: Secondary | ICD-10-CM | POA: Insufficient documentation

## 2014-01-26 DIAGNOSIS — Y998 Other external cause status: Secondary | ICD-10-CM | POA: Insufficient documentation

## 2014-01-26 DIAGNOSIS — Y9289 Other specified places as the place of occurrence of the external cause: Secondary | ICD-10-CM | POA: Diagnosis not present

## 2014-01-26 DIAGNOSIS — Z79899 Other long term (current) drug therapy: Secondary | ICD-10-CM | POA: Insufficient documentation

## 2014-01-26 DIAGNOSIS — S93401A Sprain of unspecified ligament of right ankle, initial encounter: Secondary | ICD-10-CM

## 2014-01-26 DIAGNOSIS — Y9389 Activity, other specified: Secondary | ICD-10-CM | POA: Insufficient documentation

## 2014-01-26 MED ORDER — HYDROCODONE-ACETAMINOPHEN 5-325 MG PO TABS: 1.0000 | ORAL_TABLET | Freq: Four times a day (QID) | ORAL | Status: DC | PRN

## 2014-01-26 MED ORDER — IBUPROFEN 800 MG PO TABS: 800.0000 mg | ORAL_TABLET | Freq: Three times a day (TID) | ORAL | Status: DC

## 2014-01-26 MED ORDER — HYDROCODONE-ACETAMINOPHEN 5-325 MG PO TABS
1.0000 | ORAL_TABLET | Freq: Once | ORAL | Status: AC
  Administered 2014-01-26: 1 via ORAL
  Filled 2014-01-26: qty 1

## 2014-01-26 NOTE — ED Notes (Signed)
Pt A+Ox4, reports slipped and fell on wet tile floor, "heard a pop" in R ankle, c/o 9/10 pain to area.  +swelling noted.  +csm/+pulses.  Ice applied.  Skin PWD.  MAEI.  Speaking full/clear sentences.  NAD.

## 2014-01-26 NOTE — Discharge Instructions (Signed)

## 2014-01-26 NOTE — ED Notes (Signed)
Ortho tech aware of orders.  

## 2014-01-26 NOTE — ED Provider Notes (Signed)
CSN: [id number]     Arrival date & time 01/26/14  1551 History  This chart was scribed for non-physician practitioner, Cherylann Parr, PA-C, working with Orpah Greek, MD by Ladene Artist, ED Scribe. This patient was seen in room WTR7/WTR7 and the patient's care was started at 4:24 PM.   Chief Complaint  Patient presents with  . Ankle Pain    R ankle, after slip and fall   The history is provided by the patient. No language interpreter was used.   HPI Comments: Kristen David is a 33 y.o. female who presents to the Emergency Department complaining of slip and fall that occurred approximately 1 hour PTA. She states that her R ankle rolled underneath her upon falling. She reports associated R ankle pain that is constant and radiates up R calf, joint swelling and a tingling sensation in her ankle. Pt denies numbness. No previous injury to R ankle. No h/o seizures. No known allergies.   PCP: Maximino Greenland, MD  Past Medical History  Diagnosis Date  . Hidradenitis suppurativa    History reviewed. No pertinent past surgical history. Family History  Problem Relation Age of Onset  . Diabetes Mother   . Cancer Mother     Breast   History  Substance Use Topics  . Smoking status: Never Smoker   . Smokeless tobacco: Never Used  . Alcohol Use: Yes     Comment: social drinking   OB History    No data available     Review of Systems  Musculoskeletal: Positive for joint swelling and arthralgias.  Neurological: Negative for numbness.  All other systems reviewed and are negative.  Allergies  Review of patient's allergies indicates no known allergies.  Home Medications   Prior to Admission medications   Medication Sig Start Date End Date Taking? Authorizing Provider  citalopram (CELEXA) 20 MG tablet Take 20 mg by mouth daily.    Historical Provider, MD  HYDROcodone-acetaminophen (NORCO/VICODIN) 5-325 MG per tablet Take 1 tablet by mouth every 6 (six) hours as needed  for moderate pain or severe pain. 01/26/14   Haya Hemler A Forcucci, PA-C  ibuprofen (ADVIL,MOTRIN) 800 MG tablet Take 1 tablet (800 mg total) by mouth 3 (three) times daily. 01/26/14   Shaquilla Kehres A Forcucci, PA-C  traZODone (DESYREL) 50 MG tablet Take 50 mg by mouth at bedtime.    Historical Provider, MD   Triage Vitals: BP 126/86 mmHg  Pulse 75  Temp(Src) 98.5 F (36.9 C) (Oral)  Resp 16  Ht 5' (1.524 m)  Wt 180 lb (81.647 kg)  BMI 35.15 kg/m2  SpO2 100%  LMP 01/23/2014 Physical Exam  Constitutional: She is oriented to person, place, and time. She appears well-developed and well-nourished. No distress.  HENT:  Head: Normocephalic and atraumatic.  Eyes: Conjunctivae and EOM are normal.  Neck: Neck supple. No tracheal deviation present.  Cardiovascular: Normal rate, regular rhythm, normal heart sounds and intact distal pulses.  Exam reveals no gallop and no friction rub.   No murmur heard. Pulses:      Dorsalis pedis pulses are 2+ on the right side.       Posterior tibial pulses are 2+ on the right side.  Pulmonary/Chest: Effort normal and breath sounds normal. No respiratory distress. She has no wheezes. She has no rales. She exhibits no tenderness.  Musculoskeletal:       Right ankle: She exhibits decreased range of motion and swelling. She exhibits no ecchymosis, no deformity, no laceration and normal  pulse. Tenderness. Lateral malleolus, medial malleolus, AITFL and posterior TFL tenderness found. No CF ligament, no head of 5th metatarsal and no proximal fibula tenderness found. Achilles tendon normal.  Neurological: She is alert and oriented to person, place, and time.  Skin: Skin is warm and dry.  Psychiatric: She has a normal mood and affect. Her behavior is normal. Judgment and thought content normal.  Nursing note and vitals reviewed.  ED Course  Procedures (including critical care time) DIAGNOSTIC STUDIES: Oxygen Saturation is 100% on RA, normal by my interpretation.     COORDINATION OF CARE: 4:31 PM-Discussed treatment plan which includes ankle brace, crutches and Hydrocodone with pt at bedside and pt agreed to plan.   Labs Review Labs Reviewed - No data to display  Imaging Review Dg Ankle Complete Right  01/26/2014   CLINICAL DATA:  Status post slipping injury on wet towel floor today ; patient reports hearing a pop in the right ankle ; generalized ankle pain  EXAM: RIGHT ANKLE - COMPLETE 3+ VIEW  COMPARISON:  None.  FINDINGS: The ankle joint mortise is preserved. The talar dome is intact. There is no acute malleolar fracture. The calcaneus is unremarkable. The metatarsal bases are normal. There is a moderate amount of soft tissue swelling anteriorly and laterally.  IMPRESSION: There is no acute fracture nor dislocation of the ankle. There is soft tissue swelling anterolaterally.   Electronically Signed   By: David  [place]   On: 01/26/2014 16:14    EKG Interpretation None      MDM   Final diagnoses:  Pain  Right ankle sprain, initial encounter   Patient is a 33 year old female who presents emergency room for evaluation of right ankle pain after a fall. Physical exam reveals neurovascularly intact right ankle. Plain film x-rays are negative for acute fractures. Suspect that this is likely a sprain. We'll place the patient in an ASO brace and will give crutches. Patient can weight-bear as tolerated. I will also prescribe hydrocodone and ibuprofen. Patient to be given work note for 3 days. Patient to follow-up with her PCP. Patient to return for compartment syndrome symptoms. Patient states understanding and agreement at this time. Patient stable for discharge  I personally performed the services described in this documentation, which was scribed in my presence. The recorded information has been reviewed and is accurate.    Cherylann Parr, PA-C 01/26/14 [place], MD 01/26/14 [phone]

## 2014-02-15 ENCOUNTER — Other Ambulatory Visit: Payer: Self-pay | Admitting: Nurse Practitioner

## 2014-02-17 ENCOUNTER — Other Ambulatory Visit: Payer: Self-pay | Admitting: Nurse Practitioner

## 2014-02-17 DIAGNOSIS — T148XXA Other injury of unspecified body region, initial encounter: Secondary | ICD-10-CM

## 2014-08-24 ENCOUNTER — Encounter (HOSPITAL_COMMUNITY): Payer: Self-pay | Admitting: *Deleted

## 2014-08-24 ENCOUNTER — Emergency Department (HOSPITAL_COMMUNITY)
Admission: EM | Admit: 2014-08-24 | Discharge: 2014-08-24 | Disposition: A | Discharge status: Home / Self Care | Payer: Medicaid Other | Attending: Emergency Medicine | Admitting: Emergency Medicine

## 2014-08-24 DIAGNOSIS — L02214 Cutaneous abscess of groin: Secondary | ICD-10-CM | POA: Insufficient documentation

## 2014-08-24 DIAGNOSIS — Z79899 Other long term (current) drug therapy: Secondary | ICD-10-CM | POA: Insufficient documentation

## 2014-08-24 DIAGNOSIS — L0231 Cutaneous abscess of buttock: Secondary | ICD-10-CM | POA: Insufficient documentation

## 2014-08-24 MED ORDER — SULFAMETHOXAZOLE-TRIMETHOPRIM 800-160 MG PO TABS: 1.0000 | ORAL_TABLET | Freq: Two times a day (BID) | ORAL | Status: AC

## 2014-08-24 MED ORDER — CEPHALEXIN 500 MG PO CAPS: 500.0000 mg | ORAL_CAPSULE | Freq: Four times a day (QID) | ORAL | Status: DC

## 2014-08-24 MED ORDER — OXYCODONE-ACETAMINOPHEN 5-325 MG PO TABS: 1.0000 | ORAL_TABLET | ORAL | Status: DC | PRN

## 2014-08-24 NOTE — Discharge Instructions (Signed)
Soak in warm tub 3 times a day for 30 minutes. See your doctor or return here if needed, for problems. You will likely need another consultation with surgery if the problem persists.   Abscess An abscess is an infected area that contains a collection of pus and debris.It can occur in almost any part of the body. An abscess is also known as a furuncle or boil. CAUSES  An abscess occurs when tissue gets infected. This can occur from blockage of oil or sweat glands, infection of hair follicles, or a minor injury to the skin. As the body tries to fight the infection, pus collects in the area and creates pressure under the skin. This pressure causes pain. People with weakened immune systems have difficulty fighting infections and get certain abscesses more often.  SYMPTOMS Usually an abscess develops on the skin and becomes a painful mass that is red, warm, and tender. If the abscess forms under the skin, you may feel a moveable soft area under the skin. Some abscesses break open (rupture) on their own, but most will continue to get worse without care. The infection can spread deeper into the body and eventually into the bloodstream, causing you to feel ill.  DIAGNOSIS  Your caregiver will take your medical history and perform a physical exam. A sample of fluid may also be taken from the abscess to determine what is causing your infection. TREATMENT  Your caregiver may prescribe antibiotic medicines to fight the infection. However, taking antibiotics alone usually does not cure an abscess. Your caregiver may need to make a small cut (incision) in the abscess to drain the pus. In some cases, gauze is packed into the abscess to reduce pain and to continue draining the area. HOME CARE INSTRUCTIONS   Only take over-the-counter or prescription medicines for pain, discomfort, or fever as directed by your caregiver.  If you were prescribed antibiotics, take them as directed. Finish them even if you start to  feel better.  If gauze is used, follow your caregiver's directions for changing the gauze.  To avoid spreading the infection:  Keep your draining abscess covered with a bandage.  Wash your hands well.  Do not share personal care items, towels, or whirlpools with others.  Avoid skin contact with others.  Keep your skin and clothes clean around the abscess.  Keep all follow-up appointments as directed by your caregiver. SEEK MEDICAL CARE IF:   You have increased pain, swelling, redness, fluid drainage, or bleeding.  You have muscle aches, chills, or a general ill feeling.  You have a fever. MAKE SURE YOU:   Understand these instructions.  Will watch your condition.  Will get help right away if you are not doing well or get worse. Document Released: 10/31/2004 Document Revised: 07/23/2011 Document Reviewed: 04/05/2011 [facility] Patient Information 2015 [place], [place]. This information is not intended to replace advice given to you by your health care provider. Make sure you discuss any questions you have with your health care provider.

## 2014-08-24 NOTE — ED Provider Notes (Signed)
CSN: [id number]     Arrival date & time 08/24/14  1524 History  This chart was scribed for No att. providers found by Mercy Moore, ED scribe.  This patient was seen in room [place] and the patient's care was started at 4:24 PM.   Chief Complaint  Patient presents with  . Abscess   The history is provided by the patient. No language interpreter was used.   HPI Comments: Kristen David is a 34 y.o. female with PMHx of hidradenitis suppurativa who presents to the Emergency Department complaining of multiple abscesses to groin and right buttocks for two weeks now. Patient states that they all have begun draining, but the pain is worsening. Patient reports treatment with warm compresses, 800mg  ibuprofen, and Hydrocodone without relief. Patient reports surgery to both axillas and states that since her abscesses are developing down below. Patient has consulted a surgeon who feels that surgery is worth the scarring to her groin. Patient denies denies fever or vomiting. Patient is as a med Dance movement psychotherapist.   Past Medical History  Diagnosis Date  . Hidradenitis suppurativa    History reviewed. No pertinent past surgical history. Family History  Problem Relation Age of Onset  . Diabetes Mother   . Cancer Mother     Breast   History  Substance Use Topics  . Smoking status: Never Smoker   . Smokeless tobacco: Never Used  . Alcohol Use: Yes     Comment: social drinking   OB History    No data available     Review of Systems  All other systems reviewed and are negative.  Allergies  Tramadol  Home Medications   Prior to Admission medications   Medication Sig Start Date End Date Taking? Authorizing Provider  Multiple Vitamin (MULTIVITAMIN WITH MINERALS) TABS tablet Take 1 tablet by mouth daily.   Yes Historical Provider, MD  cephALEXin (KEFLEX) 500 MG capsule Take 1 capsule (500 mg total) by mouth 4 (four) times daily. 08/24/14   Daleen Bo, MD  ibuprofen (ADVIL,MOTRIN) 800 MG tablet  Take 1 tablet (800 mg total) by mouth 3 (three) times daily. Patient not taking: Reported on 08/24/2014 01/26/14   Starlyn Skeans, PA-C  oxyCODONE-acetaminophen (PERCOCET) 5-325 MG per tablet Take 1 tablet by mouth every 4 (four) hours as needed for severe pain. 08/24/14   Daleen Bo, MD  sulfamethoxazole-trimethoprim (BACTRIM DS,SEPTRA DS) 800-160 MG per tablet Take 1 tablet by mouth 2 (two) times daily. 08/24/14 08/31/14  Daleen Bo, MD   BP 117/84 mmHg  Pulse 81  Temp(Src) 98.1 F (36.7 C) (Oral)  Resp 18  SpO2 97%  LMP 08/17/2014 Physical Exam  Constitutional: She is oriented to person, place, and time. She appears well-developed and well-nourished.  HENT:  Head: Normocephalic and atraumatic.  Right Ear: External ear normal.  Left Ear: External ear normal.  Eyes: Conjunctivae and EOM are normal. Pupils are equal, round, and reactive to light.  Neck: Normal range of motion and phonation normal. Neck supple.  Cardiovascular: Normal rate, regular rhythm and normal heart sounds.   Pulmonary/Chest: Effort normal and breath sounds normal. She exhibits no bony tenderness.  Abdominal: Soft. There is no tenderness.  Musculoskeletal: Normal range of motion.  Neurological: She is alert and oriented to person, place, and time. No cranial nerve deficit or sensory deficit. She exhibits normal muscle tone. Coordination normal.  Skin: Skin is warm, dry and intact.  Left groin has 1.5 x 3cm raised indurated, tender area which is not draining.  Right groin has an area of drainage with purulent discharge but no associated swelling. Right medial buttocks has a 2cm superficial area of skin sloughing without associated drainage or induration.   Psychiatric: She has a normal mood and affect. Her behavior is normal. Judgment and thought content normal.  Nursing note and vitals reviewed.   ED Course  Procedures (including critical care time)  COORDINATION OF CARE: 4:24 PM- Discussed treatment plan  with patient at bedside and patient agreed to plan.   Medications - No data to display  Patient Vitals for the past 24 hrs:  BP Temp Temp src Pulse Resp SpO2  08/24/14 1707 - - - 80 - 99 %  08/24/14 1531 117/84 mmHg 98.1 F (36.7 C) Oral 81 18 97 %    Labs Review Labs Reviewed - No data to display  Imaging Review No results found.   EKG Interpretation None      MDM   Final diagnoses:  Abscess of groin, left    Multiple perineal abscesses, without evidence for systemic illness. The left groin abscess is not draining, but will likely drain spontaneously.This is an area which is not easily amenable to I&D, in the emergency department setting.Patient is in agreement with symptomatic treatment, and will follow-up with surgery for further as needed.   Nursing Notes Reviewed/ Care Coordinated Applicable Imaging Reviewed Interpretation of Laboratory Data incorporated into ED treatment  The patient appears reasonably screened and/or stabilized for discharge and I doubt any other medical condition or other [facility] requiring further screening, evaluation, or treatment in the ED at this time prior to discharge.  Plan: Home Medications- Keflex, Septra and Percocet; Home Treatments- Aggressive warm soaks; return here if the recommended treatment, does not improve the symptoms; Recommended follow up- pCP. or general surgery, when necessary.   I personally performed the services described in this documentation, which was scribed in my presence. The recorded information has been reviewed and is accurate.      Daleen Bo, MD 08/25/14 [phone]

## 2014-08-24 NOTE — ED Notes (Signed)
Pt complains of abscesses on the inside of her thigh and right buttock for the past 2 weeks. Pt states they began draining a couple of days ago and that the pressure has become painful.

## 2017-04-07 LAB — LIPID PANEL
CHOLESTEROL: 150 (ref 0–200)
HDL: 62 (ref 35–70)
LDL CALC: 71
LDL/HDL RATIO: 1.1
TRIGLYCERIDES: 85 (ref 40–160)

## 2017-04-07 LAB — CBC AND DIFFERENTIAL
HEMATOCRIT: 38 (ref 36–46)
HEMOGLOBIN: 11.8 — AB (ref 12.0–16.0)
NEUTROS ABS: 4
PLATELETS: 568 — AB (ref 150–399)
WBC: 7.3

## 2017-04-07 LAB — HEPATIC FUNCTION PANEL
ALT: 8 (ref 7–35)
AST: 13 (ref 13–35)
Alkaline Phosphatase: 94 (ref 25–125)
Bilirubin, Total: 0.3

## 2017-04-07 LAB — BASIC METABOLIC PANEL
BUN: 11 (ref 4–21)
Creatinine: 1.1 (ref 0.5–1.1)
Glucose: 86
Potassium: 4.7 (ref 3.4–5.3)
Sodium: 140 (ref 137–147)

## 2017-04-07 LAB — VITAMIN D 25 HYDROXY (VIT D DEFICIENCY, FRACTURES): Vit D, 25-Hydroxy: 22.1

## 2017-04-07 LAB — HEMOGLOBIN A1C: Hemoglobin A1C: 5.6

## 2017-04-07 LAB — TSH: TSH: 0.78 (ref 0.41–5.90)

## 2017-06-05 MED FILL — CITALOPRAM HBR 20 MG TABLET: 20 | 90 days supply | Qty: 90 | Fill #0

## 2017-07-21 ENCOUNTER — Ambulatory Visit (INDEPENDENT_AMBULATORY_CARE_PROVIDER_SITE_OTHER): Payer: Self-pay | Admitting: Family Medicine

## 2017-07-21 ENCOUNTER — Telehealth: Payer: Self-pay | Admitting: Family

## 2017-07-21 VITALS — BP 120/78 | HR 91 | Temp 98.9°F | Resp 20

## 2017-07-21 DIAGNOSIS — L732 Hidradenitis suppurativa: Secondary | ICD-10-CM

## 2017-07-21 DIAGNOSIS — R110 Nausea: Secondary | ICD-10-CM

## 2017-07-21 DIAGNOSIS — B379 Candidiasis, unspecified: Secondary | ICD-10-CM

## 2017-07-21 DIAGNOSIS — R52 Pain, unspecified: Secondary | ICD-10-CM

## 2017-07-21 MED ORDER — DOXYCYCLINE HYCLATE 100 MG PO TABS: 100.0000 mg | ORAL_TABLET | Freq: Two times a day (BID) | ORAL | 0 refills | Status: AC

## 2017-07-21 MED ORDER — ONDANSETRON HCL 4 MG PO TABS: 4.0000 mg | ORAL_TABLET | Freq: Three times a day (TID) | ORAL | 0 refills | Status: DC | PRN

## 2017-07-21 MED ORDER — CLINDAMYCIN PHOSPHATE 1 % EX GEL: Freq: Two times a day (BID) | CUTANEOUS | 1 refills | Status: DC

## 2017-07-21 MED ORDER — TRAMADOL HCL 50 MG PO TABS: 50.0000 mg | ORAL_TABLET | Freq: Three times a day (TID) | ORAL | 0 refills | Status: AC | PRN

## 2017-07-21 MED ORDER — FLUCONAZOLE 150 MG PO TABS: 150.0000 mg | ORAL_TABLET | Freq: Once | ORAL | 0 refills | Status: AC

## 2017-07-21 MED ORDER — DOXYCYCLINE HYCLATE 100 MG PO TABS: 100.0000 mg | ORAL_TABLET | Freq: Two times a day (BID) | ORAL | 0 refills | Status: DC

## 2017-07-21 NOTE — Progress Notes (Signed)
E Visit for Rash  We are sorry that you are not feeling well. Here is how we plan to help!  Doxycycline 100 mg twice per day for 7 days   HOME CARE:   Take cool showers and avoid direct sunlight.  Apply cool compress or wet dressings.  Take a bath in an oatmeal bath.  Sprinkle content of one Aveeno packet under running faucet with comfortably warm water.  Bathe for 15-20 minutes, 1-2 times daily.  Pat dry with a towel. Do not rub the rash.  Use hydrocortisone cream.  Take an antihistamine like Benadryl for widespread rashes that itch.  The adult dose of Benadryl is 25-50 mg by mouth 4 times daily.  Caution:  This type of medication may cause sleepiness.  Do not drink alcohol, drive, or operate dangerous machinery while taking antihistamines.  Do not take these medications if you have prostate enlargement.  Read package instructions thoroughly on all medications that you take.  GET HELP RIGHT AWAY IF:   Symptoms don't go away after treatment.  Severe itching that persists.  If you rash spreads or swells.  If you rash begins to smell.  If it blisters and opens or develops a yellow-brown crust.  You develop a fever.  You have a sore throat.  You become short of breath.  MAKE SURE YOU:  Understand these instructions. Will watch your condition. Will get help right away if you are not doing well or get worse.  Thank you for choosing an e-visit. Your e-visit answers were reviewed by a board certified advanced clinical practitioner to complete your personal care plan. Depending upon the condition, your plan could have included both over the counter or prescription medications. Please review your pharmacy choice. Be sure that the pharmacy you have chosen is open so that you can pick up your prescription now.  If there is a problem you may message your provider in MyChart to have the prescription routed to another pharmacy. Your safety is important to us. If you have drug  allergies check your prescription carefully.  For the next 24 hours, you can use MyChart to ask questions about today's visit, request a non-urgent call back, or ask for a work or school excuse from your e-visit provider. You will get an email in the next two days asking about your experience. I hope that your e-visit has been valuable and will speed your recovery.      

## 2017-07-21 NOTE — Patient Instructions (Signed)
Hidradenitis Suppurativa Hidradenitis suppurativa is a long-term (chronic) skin disease that starts with blocked sweat glands or hair follicles. Bacteria may grow in these blocked openings of your skin. Hidradenitis suppurativa is like a severe form of acne that develops in areas of your body where acne would be unusual. It is most likely to affect the areas of your body where skin rubs against skin and becomes moist. This includes your:  Underarms.  Groin.  Genital areas.  Buttocks.  Upper thighs.  Breasts.  Hidradenitis suppurativa may start out with small pimples. The pimples can develop into deep sores that break open (rupture) and drain pus. Over time your skin may thicken and become scarred. Hidradenitis suppurativa cannot be passed from person to person. What are the causes? The exact cause of hidradenitis suppurativa is not known. This condition may be due to:  Female and female hormones. The condition is rare before and after puberty.  An overactive body defense system (immune system). Your immune system may overreact to the blocked hair follicles or sweat glands and cause swelling and pus-filled sores.  What increases the risk? You may have a higher risk of hidradenitis suppurativa if you:  Are a woman.  Are between ages 11 and 55.  Have a family history of hidradenitis suppurativa.  Have a personal history of acne.  Are overweight.  Smoke.  Take the drug lithium.  What are the signs or symptoms? The first signs of an outbreak are usually painful skin bumps that look like pimples. As the condition progresses:  Skin bumps may get bigger and grow deeper into the skin.  Bumps under the skin may rupture and drain smelly pus.  Skin may become itchy and infected.  Skin may thicken and scar.  Drainage may continue through tunnels under the skin (fistulas).  Walking and moving your arms can become painful.  How is this diagnosed? Your health care provider may  diagnose hidradenitis suppurativa based on your medical history and your signs and symptoms. A physical exam will also be done. You may need to see a health care provider who specializes in skin diseases (dermatologist). You may also have tests done to confirm the diagnosis. These can include:  Swabbing a sample of pus or drainage from your skin so it can be sent to the lab and tested for infection.  Blood tests to check for infection.  How is this treated? The same treatment will not work for everybody with hidradenitis suppurativa. Your treatment will depend on how severe your symptoms are. You may need to try several treatments to find what works best for you. Part of your treatment may include cleaning and bandaging (dressing) your wounds. You may also have to take medicines, such as the following:  Antibiotics.  Acne medicines.  Medicines to block or suppress the immune system.  A diabetes medicine (metformin) is sometimes used to treat this condition.  For women, birth control pills can sometimes help relieve symptoms.  You may need surgery if you have a severe case of hidradenitis suppurativa that does not respond to medicine. Surgery may involve:  Using a laser to clear the skin and remove hair follicles.  Opening and draining deep sores.  Removing the areas of skin that are diseased and scarred.  Follow these instructions at home:  Learn as much as you can about your disease, and work closely with your health care providers.  Take medicines only as directed by your health care provider.  If you were prescribed   an antibiotic medicine, finish it all even if you start to feel better.  If you are overweight, losing weight may be very helpful. Try to reach and maintain a healthy weight.  Do not use any tobacco products, including cigarettes, chewing tobacco, or electronic cigarettes. If you need help quitting, ask your health care provider.  Do not shave the areas where you  get hidradenitis suppurativa.  Do not wear deodorant.  Wear loose-fitting clothes.  Try not to overheat and get sweaty.  Take a daily bleach bath as directed by your health care provider. ? Fill your bathtub halfway with water. ? Pour in  cup of unscented household bleach. ? Soak for 5-10 minutes.  Cover sore areas with a warm, clean washcloth (compress) for 5-10 minutes. Contact a health care provider if:  You have a flare-up of hidradenitis suppurativa.  You have chills or a fever.  You are having trouble controlling your symptoms at home. This information is not intended to replace advice given to you by your health care provider. Make sure you discuss any questions you have with your health care provider. Document Released: 09/05/2003 Document Revised: 06/29/2015 Document Reviewed: 04/23/2013 Elsevier Interactive Patient Education  2018 [employer]  

## 2017-07-21 NOTE — Progress Notes (Signed)
Kristen David is a 37 y.o. female who presents today with concerns of an exacerbation of a chronic condition hidradenitis suppurativa she reports attempting to self treat with episodic rounds of antibiotic and this most recent episode is causing her pain and distress.  Review of Systems  Constitutional: Negative for chills, fever and malaise/fatigue.  HENT: Negative for congestion, ear discharge, ear pain, sinus pain and sore throat.   Eyes: Negative.   Respiratory: Negative for cough, sputum production and shortness of breath.   Cardiovascular: Negative.  Negative for chest pain.  Gastrointestinal: Negative for abdominal pain, diarrhea, nausea and vomiting.  Genitourinary: Negative for dysuria, frequency, hematuria and urgency.  Musculoskeletal: Negative for myalgias.  Skin: Negative.   Neurological: Negative for headaches.  Endo/Heme/Allergies: Negative.   Psychiatric/Behavioral: Negative.     O: Vitals:   07/21/17 1139  BP: 120/78  Pulse: 91  Resp: 20  Temp: 98.9 F (37.2 C)  SpO2: 98%     Physical Exam  Constitutional: She is oriented to person, place, and time. Vital signs are normal. She appears well-developed and well-nourished. She is active.  Non-toxic appearance. She does not have a sickly appearance.  HENT:  Head: Normocephalic.  Right Ear: Hearing, tympanic membrane, external ear and ear canal normal.  Left Ear: Hearing, tympanic membrane, external ear and ear canal normal.  Nose: Nose normal.  Mouth/Throat: Uvula is midline and oropharynx is clear and moist.  Neck: Normal range of motion. Neck supple.  Cardiovascular: Normal rate, regular rhythm, normal heart sounds and normal pulses.  Pulmonary/Chest: Effort normal and breath sounds normal.  Abdominal: Soft. Bowel sounds are normal.  Genitourinary:     Genitourinary Comments: Multiple abscesses with sinus tracts, scarring- fluxtulent and painful to touch. Indications of chronic scaring from previous  exacerbations. Dressing with serosanguinous drainage removed.  Musculoskeletal: Normal range of motion.  Lymphadenopathy:       Head (right side): No submental and no submandibular adenopathy present.       Head (left side): No submental and no submandibular adenopathy present.    She has no cervical adenopathy.  Neurological: She is alert and oriented to person, place, and time.  Psychiatric: She has a normal mood and affect.  Vitals reviewed.    A: 1. Suppurative hidradenitis      P: Discussed the need for chronic versus episodic treatment for this condition and the typical treatment via the [facility] reviewed and discussed with the patient. Advised to seek all future care with PCP and potentially consider surgery consultation as patient reports she did have surgery for both axilla previously. Exam findings, diagnosis etiology and medication use and indications reviewed with patient. Follow- Up and discharge instructions provided. No emergent/urgent issues found on exam.  Patient verbalized understanding of information provided and agrees with plan of care (POC), all questions answered.  1. Suppurative hidradenitis - doxycycline (VIBRA-TABS) 100 MG tablet; Take 1 tablet (100 mg total) by mouth 2 (two) times daily for 15 days. - clindamycin (CLINDAGEL) 1 % gel; Apply topically 2 (two) times daily. - traMADol (ULTRAM) 50 MG tablet; Take 1 tablet (50 mg total) by mouth every 8 (eight) hours as needed for up to 3 days.  2. Nausea - ondansetron (ZOFRAN) 4 MG tablet; Take 1 tablet (4 mg total) by mouth every 8 (eight) hours as needed for nausea or vomiting.  3. Pain - traMADol (ULTRAM) 50 MG tablet; Take 1 tablet (50 mg total) by mouth every 8 (eight) hours as needed for up  to 3 days.  4. Yeast infection - fluconazole (DIFLUCAN) 150 MG tablet; Take 1 tablet (150 mg total) by mouth once for 1 dose.

## 2017-09-04 ENCOUNTER — Ambulatory Visit: Payer: Self-pay | Admitting: Surgery

## 2017-09-04 NOTE — H&P (View-Only) (Signed)
Kristen David Documented: 09/04/2017 12:07 PM Location: [facility] Patient #: [id number] DOB: 1980/08/01 Single / Language: Kristen David / Race: Black or African American Female  History of Present Illness (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 She has had bilateral axillary excisions and has some issues with the wound opening up now and then. After that was done, she started to develop lesions in her perineum and groin. She describes painful swelling areas that will then drained and then take months to heal. She is careful about keeping the area clean and dry, using cotton undergarments, avoiding perfumed or detergent agents and watching her clothes in hot water. She has tried various courses of oral antibiotics as well as topical ointments.  The patient is a 37 year old female.   Past Surgical History [name], [name]; 09/04/2017 12:08 PM) No pertinent past surgical history  Diagnostic Studies History [name], [name]; 09/04/2017 12:08 PM) Colonoscopy never Mammogram 1-3 years ago  Allergies [name], [name]; 09/04/2017 12:09 PM) No Known Drug Allergies [09/04/2017]:  Medication History ([name], [name]; 09/04/2017 12:10 PM) CeleXA (20MG  Tablet, Oral) Active. Lunesta (1MG  Tablet, Oral) Active. Medications Reconciled  Social History [name], [name]; 09/04/2017 12:08 PM) Alcohol use Occasional alcohol use. Caffeine use Carbonated beverages. No drug use Tobacco use Never smoker.  Family History [name], [name]; 09/04/2017 12:08 PM) Arthritis Mother. Breast Cancer Mother. Hypertension Brother.  Pregnancy / Birth History [name], [name]; 09/04/2017 12:08 PM) Age at menarche 21 years. Gravida 2 Length (months) of breastfeeding 3-6 Maternal age 41-20 Para 1 Regular periods  Other Problems [name], [name];  09/04/2017 12:08 PM) Anxiety Disorder Depression     Review of Systems ([name] [name]; 09/04/2017 12:08 PM) General Not Present- Appetite Loss, Chills, Fatigue, Fever, Night Sweats, Weight Gain and Weight Loss. Skin Present- New Lesions. Not Present- Change in Wart/Mole, Dryness, Hives, Jaundice, Non-Healing Wounds, Rash and Ulcer. HEENT Present- Wears glasses/contact lenses. Not Present- Earache, Hearing Loss, Hoarseness, Nose Bleed, Oral Ulcers, Ringing in the Ears, Seasonal Allergies, Sinus Pain, Sore Throat, Visual Disturbances and Yellow Eyes. Breast Not Present- Breast Mass, Breast Pain, Nipple Discharge and Skin Changes. Cardiovascular Not Present- Chest Pain, Difficulty Breathing Lying Down, Leg Cramps, Palpitations, Rapid Heart Rate, Shortness of Breath and Swelling of Extremities. Gastrointestinal Not Present- Abdominal Pain, Bloating, Bloody Stool, Change in Bowel Habits, Chronic diarrhea, Constipation, Difficulty Swallowing, Excessive gas, Gets full quickly at meals, Hemorrhoids, Indigestion, Nausea, Rectal Pain and Vomiting. Female Genitourinary Not Present- Frequency, Nocturia, Painful Urination, Pelvic Pain and Urgency. Musculoskeletal Not Present- Back Pain, Joint Pain, Joint Stiffness, Muscle Pain, Muscle Weakness and Swelling of Extremities. Neurological Not Present- Decreased Memory, Fainting, Headaches, Numbness, Seizures, Tingling, Tremor, Trouble walking and Weakness. Psychiatric Present- Anxiety and Depression. Not Present- Bipolar, Change in Sleep Pattern, Fearful and Frequent crying. Endocrine Not Present- Cold Intolerance, Excessive Hunger, Hair Changes, Heat Intolerance, Hot flashes and New Diabetes. Hematology Not Present- Blood Thinners, Easy Bruising, Excessive bleeding, Gland problems, HIV and Persistent Infections.  Vitals ([name] [name]; 09/04/2017 12:08 PM) 09/04/2017 12:08 PM Weight: 191.25 lb Height: 60in Body Surface Area: 1.83 m Body Mass  Index: 37.35 kg/m  Temp.: 98.83F  Pulse: 72 (Regular)  P.OX: 98% (Room air) BP: 128/82 (Sitting, Left Arm, Standard)      Physical Exam ([name] MD; 09/04/2017 12:50 PM)  The physical exam findings are as follows: Note:Gen: alert and well  appearing Eye: extraocular motion intact, no scleral icterus ENT: moist mucus membranes, dentition intact Neck: no mass or thyromegaly Chest: unlabored respirations, symmetrical air entry, clear bilaterally CV: regular rate and rhythm, no pedal edema Abdomen: soft, nontender, nondistended. No mass or organomegaly MSK: strength symmetrical throughout, no deformity Neuro: grossly intact, normal gait Psych: normal mood and affect, appropriate insight Skin: warm and dry. In bilateral upper inner thighs as well as in the groin crease and perianal area she has multiple separate scars, no active sinus tracts or confluent disease. Stage II. In the left upper inner thigh there is a swollen area with thinned overlying skin, consistent with active inflammation    Assessment & Plan ([name] MD; 09/04/2017 12:52 PM)  HIDRADENITIS (L73.2) Story: She has done a lot of research on the disease and is very well-informed. We discussed the disease process and the options for treatment including all the things she is already doing. Also discussed the possibility of trying a brewers yeast free diet and what that entails; dermatology referral for systemic immunomodulation, local excision or unroofing. At this point the lesion in the upper inner thighs really the only active 1. She would like to have this area excised for at least unroofed. Discussed the risk of bleeding, infection, pain, scarring, recurrent disease elsewhere, as well as the possibility of an open wound that would have to heal from inside out. Questions answered to her satisfaction. We'll schedule in the next couple days.

## 2017-09-04 NOTE — H&P (Signed)
Kristen David Documented: 09/04/2017 12:07 PM Location: [facility] Patient #: [id number] DOB: 1980/12/15 Single / Language: Kristen David / Race: Black or African American Female  History of Present Illness (Chelsea A. 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 She has had bilateral axillary excisions and has some issues with the wound opening up now and then. After that was done, she started to develop lesions in her perineum and groin. She describes painful swelling areas that will then drained and then take months to heal. She is careful about keeping the area clean and dry, using cotton undergarments, avoiding perfumed or detergent agents and watching her clothes in hot water. She has tried various courses of oral antibiotics as well as topical ointments.  The patient is a 37 year old female.   Past Surgical History [name], [place]; 09/04/2017 12:08 PM) No pertinent past surgical history  Diagnostic Studies History [name], [place]; 09/04/2017 12:08 PM) Colonoscopy never Mammogram 1-3 years ago  Allergies [name], [place]; 09/04/2017 12:09 PM) No Known Drug Allergies [09/04/2017]:  Medication History ([name], [name]; 09/04/2017 12:10 PM) CeleXA (20MG  Tablet, Oral) Active. Lunesta (1MG  Tablet, Oral) Active. Medications Reconciled  Social History [name], [name]; 09/04/2017 12:08 PM) Alcohol use Occasional alcohol use. Caffeine use Carbonated beverages. No drug use Tobacco use Never smoker.  Family History [name], [place]; 09/04/2017 12:08 PM) Arthritis Mother. Breast Cancer Mother. Hypertension Brother.  Pregnancy / Birth History [name], [place]; 09/04/2017 12:08 PM) Age at menarche 24 years. Gravida 2 Length (months) of breastfeeding 3-6 Maternal age 42-20 Para 1 Regular periods  Other Problems [name], [name];  09/04/2017 12:08 PM) Anxiety Disorder Depression     Review of Systems ([name] [name]; 09/04/2017 12:08 PM) General Not Present- Appetite Loss, Chills, Fatigue, Fever, Night Sweats, Weight Gain and Weight Loss. Skin Present- New Lesions. Not Present- Change in Wart/Mole, Dryness, Hives, Jaundice, Non-Healing Wounds, Rash and Ulcer. HEENT Present- Wears glasses/contact lenses. Not Present- Earache, Hearing Loss, Hoarseness, Nose Bleed, Oral Ulcers, Ringing in the Ears, Seasonal Allergies, Sinus Pain, Sore Throat, Visual Disturbances and Yellow Eyes. Breast Not Present- Breast Mass, Breast Pain, Nipple Discharge and Skin Changes. Cardiovascular Not Present- Chest Pain, Difficulty Breathing Lying Down, Leg Cramps, Palpitations, Rapid Heart Rate, Shortness of Breath and Swelling of Extremities. Gastrointestinal Not Present- Abdominal Pain, Bloating, Bloody Stool, Change in Bowel Habits, Chronic diarrhea, Constipation, Difficulty Swallowing, Excessive gas, Gets full quickly at meals, Hemorrhoids, Indigestion, Nausea, Rectal Pain and Vomiting. Female Genitourinary Not Present- Frequency, Nocturia, Painful Urination, Pelvic Pain and Urgency. Musculoskeletal Not Present- Back Pain, Joint Pain, Joint Stiffness, Muscle Pain, Muscle Weakness and Swelling of Extremities. Neurological Not Present- Decreased Memory, Fainting, Headaches, Numbness, Seizures, Tingling, Tremor, Trouble walking and Weakness. Psychiatric Present- Anxiety and Depression. Not Present- Bipolar, Change in Sleep Pattern, Fearful and Frequent crying. Endocrine Not Present- Cold Intolerance, Excessive Hunger, Hair Changes, Heat Intolerance, Hot flashes and New Diabetes. Hematology Not Present- Blood Thinners, Easy Bruising, Excessive bleeding, Gland problems, HIV and Persistent Infections.  Vitals ([name] [name]; 09/04/2017 12:08 PM) 09/04/2017 12:08 PM Weight: 191.25 lb Height: 60in Body Surface Area: 1.83 m Body Mass  Index: 37.35 kg/m  Temp.: 98.25F  Pulse: 72 (Regular)  P.OX: 98% (Room air) BP: 128/82 (Sitting, Left Arm, Standard)      Physical Exam (Chelsea A. Kae Heller MD; 09/04/2017 12:50 PM)  The physical exam findings are as follows: Note:Gen: alert and well  appearing Eye: extraocular motion intact, no scleral icterus ENT: moist mucus membranes, dentition intact Neck: no mass or thyromegaly Chest: unlabored respirations, symmetrical air entry, clear bilaterally CV: regular rate and rhythm, no pedal edema Abdomen: soft, nontender, nondistended. No mass or organomegaly MSK: strength symmetrical throughout, no deformity Neuro: grossly intact, normal gait Psych: normal mood and affect, appropriate insight Skin: warm and dry. In bilateral upper inner thighs as well as in the groin crease and perianal area she has multiple separate scars, no active sinus tracts or confluent disease. Stage II. In the left upper inner thigh there is a swollen area with thinned overlying skin, consistent with active inflammation    Assessment & Plan (Chelsea A. Connor MD; 09/04/2017 12:52 PM)  HIDRADENITIS (L73.2) Story: She has done a lot of research on the disease and is very well-informed. We discussed the disease process and the options for treatment including all the things she is already doing. Also discussed the possibility of trying a brewers yeast free diet and what that entails; dermatology referral for systemic immunomodulation, local excision or unroofing. At this point the lesion in the upper inner thighs really the only active 1. She would like to have this area excised for at least unroofed. Discussed the risk of bleeding, infection, pain, scarring, recurrent disease elsewhere, as well as the possibility of an open wound that would have to heal from inside out. Questions answered to her satisfaction. We'll schedule in the next couple days.

## 2017-09-09 NOTE — Pre-Procedure Instructions (Signed)
Kristine Royal Forget  09/09/2017      [facility] [place], [place] - [address] [place] [address] Phone: [phone] Fax: [phone]    Your procedure is scheduled on September 12, 2017.  Report to [facility] Admitting at 935 AM.  Call this number if you have problems the morning of surgery:  [phone]   Remember:  Do not eat or drink after midnight.      Take these medicines the morning of surgery with A SIP OF WATER (none)  Beginning now, STOP taking any Aspirin (unless otherwise instructed by your surgeon), Aleve, Naproxen, Ibuprofen, Motrin, Advil, Goody's, BC's, all herbal medications, fish oil, and all vitamins   Do not wear jewelry, make-up or nail polish.  Do not wear lotions, powders, or perfumes, or deodorant.  Do not shave 48 hours prior to surgery.    Do not bring valuables to the hospital.  [facility] is not responsible for any belongings or valuables.  Contacts, dentures or bridgework may not be worn into surgery.  Leave your suitcase in the car.  After surgery it may be brought to your room.  For patients admitted to the hospital, discharge time will be determined by your treatment team.  Patients discharged the day of surgery will not be allowed to drive home.    [facility]- Preparing For Surgery  Before surgery, you can play an important role. Because skin is not sterile, your skin needs to be as free of germs as possible. You can reduce the number of germs on your skin by washing with CHG (chlorahexidine gluconate) Soap before surgery.  CHG is an antiseptic cleaner which kills germs and bonds with the skin to continue killing germs even after washing.    Oral Hygiene is also important to reduce your risk of infection.  Remember - BRUSH YOUR TEETH THE MORNING OF SURGERY WITH YOUR REGULAR TOOTHPASTE  Please do not use if you have an allergy to CHG or antibacterial soaps.  If your skin becomes reddened/irritated stop using the CHG.  Do not shave (including legs and underarms) for at least 48 hours prior to first CHG shower. It is OK to shave your face.  Please follow these instructions carefully.   1. Shower the NIGHT BEFORE SURGERY and the MORNING OF SURGERY with CHG.   2. If you chose to wash your hair, wash your hair first as usual with your normal shampoo.  3. After you shampoo, rinse your hair and body thoroughly to remove the shampoo.  4. Use CHG as you would any other liquid soap. You can apply CHG directly to the skin and wash gently with a scrungie or a clean washcloth.   5. Apply the CHG Soap to your body ONLY FROM THE NECK DOWN.  Do not use on open wounds or open sores. Avoid contact with your eyes, ears, mouth and genitals (private parts). Wash Face and genitals (private parts)  with your normal soap.  6. Wash thoroughly, paying special attention to the area where your surgery will be performed.  7. Thoroughly rinse your body with warm water from the neck down.  8. DO NOT shower/wash with your normal soap after using and rinsing off the CHG Soap.  9. Pat yourself dry with a CLEAN TOWEL.  10. Wear CLEAN PAJAMAS to bed the night before surgery, wear comfortable clothes the morning of surgery  11. Place CLEAN SHEETS on your bed the night of your first shower and DO NOT SLEEP WITH PETS.  Day of Surgery:  Do not apply any deodorants/lotions.  Please wear clean clothes to the hospital/surgery center.   Remember to brush your teeth WITH YOUR REGULAR TOOTHPASTE.  Please read over the following fact sheets that you were given. Pain Booklet, Coughing and Deep Breathing and Surgical Site Infection Prevention

## 2017-09-10 ENCOUNTER — Encounter (HOSPITAL_COMMUNITY): Payer: Self-pay

## 2017-09-10 ENCOUNTER — Other Ambulatory Visit: Payer: Self-pay

## 2017-09-10 ENCOUNTER — Encounter (HOSPITAL_COMMUNITY)
Admission: RE | Admit: 2017-09-10 | Discharge: 2017-09-10 | Disposition: A | Discharge status: Home / Self Care | Payer: No Typology Code available for payment source | Source: Ambulatory Visit | Attending: Surgery | Admitting: Surgery

## 2017-09-10 DIAGNOSIS — L732 Hidradenitis suppurativa: Secondary | ICD-10-CM | POA: Diagnosis present

## 2017-09-10 DIAGNOSIS — Z6836 Body mass index (BMI) 36.0-36.9, adult: Secondary | ICD-10-CM | POA: Diagnosis not present

## 2017-09-10 DIAGNOSIS — F419 Anxiety disorder, unspecified: Secondary | ICD-10-CM | POA: Diagnosis not present

## 2017-09-10 DIAGNOSIS — F329 Major depressive disorder, single episode, unspecified: Secondary | ICD-10-CM | POA: Diagnosis not present

## 2017-09-10 DIAGNOSIS — E669 Obesity, unspecified: Secondary | ICD-10-CM | POA: Diagnosis not present

## 2017-09-10 DIAGNOSIS — Z79899 Other long term (current) drug therapy: Secondary | ICD-10-CM | POA: Diagnosis not present

## 2017-09-10 HISTORY — DX: Major depressive disorder, single episode, unspecified: F32.9

## 2017-09-10 HISTORY — DX: Anxiety disorder, unspecified: F41.9

## 2017-09-10 HISTORY — DX: Depression, unspecified: F32.A

## 2017-09-10 HISTORY — DX: Tachycardia, unspecified: R00.0

## 2017-09-10 LAB — BASIC METABOLIC PANEL
Anion gap: 10 (ref 5–15)
BUN: 8 mg/dL (ref 6–20)
CHLORIDE: 102 mmol/L (ref 98–111)
CO2: 26 mmol/L (ref 22–32)
CREATININE: 1.02 mg/dL — AB (ref 0.44–1.00)
Calcium: 9 mg/dL (ref 8.9–10.3)
GFR calc Af Amer: >60 mL/min (ref >60)
GFR calc non Af Amer: >60 mL/min (ref >60)
GLUCOSE: 89 mg/dL (ref 70–99)
POTASSIUM: 3.8 mmol/L (ref 3.5–5.1)
SODIUM: 138 mmol/L (ref 135–145)

## 2017-09-10 LAB — CBC
HEMATOCRIT: 36.7 % (ref 36.0–46.0)
Hemoglobin: 11.1 g/dL — ABNORMAL LOW (ref 12.0–15.0)
MCH: 25.3 pg — ABNORMAL LOW (ref 26.0–34.0)
MCHC: 30.2 g/dL (ref 30.0–36.0)
MCV: 83.8 fL (ref 78.0–100.0)
PLATELETS: 512 10*3/uL — AB (ref 150–400)
RBC: 4.38 MIL/uL (ref 3.87–5.11)
RDW: 15.6 % — AB (ref 11.5–15.5)
WBC: 6.4 10*3/uL (ref 4.0–10.5)

## 2017-09-10 NOTE — Progress Notes (Signed)
PCP - Glendale Chard Cardiologist - denies  EKG - 09/10/2017 - pt with hx tachycardia occasionally up to 130-140. Pt states she has been having this for years but has not had an episode in a few months. PCP is aware and per pt attributes to Anxiety. No other symptoms with this.    Patient denies shortness of breath, fever, cough and chest pain at PAT appointment   Patient verbalized understanding of instructions that were given to them at the PAT appointment. Patient was also instructed that they will need to review over the PAT instructions again at home before surgery.

## 2017-09-11 NOTE — Progress Notes (Signed)
Anesthesia Chart Review:   Case:  [id number] Date/Time:  09/12/17 1120   Procedure:  EXCISION OF LEFT INNER THIGH HIDRADENITIS (Left )   Anesthesia type:  Monitor Anesthesia Care   Pre-op diagnosis:  Hidradenitis   Location:  [facility] OR ROOM 09 / [facility]   Surgeon:  Clovis Riley, MD      DISCUSSION:  - Pt is a 37 year old female with hx anxiety  - Pt reported at pre-admission testing hx occasional episodes of tachycardia (130's-140's) associated with anxiety.  No episode in several months.  HR on VS and EKG normal today.    VS: BP 122/74   Pulse 87   Temp 36.8 C   Resp 20   Ht 5' (1.524 m)   Wt 84.1 kg   LMP 08/27/2017   SpO2 98%   BMI 36.21 kg/m   PROVIDERS: - PCP is Glendale Chard, MD   LABS: Labs reviewed: Acceptable for surgery. (all labs ordered are listed, but only abnormal results are displayed)  Labs Reviewed  BASIC METABOLIC PANEL - Abnormal; Notable for the following components:      Result Value   Creatinine, Ser 1.02 (*)    All other components within normal limits  CBC - Abnormal; Notable for the following components:   Hemoglobin 11.1 (*)    MCH 25.3 (*)    RDW 15.6 (*)    Platelets 512 (*)    All other components within normal limits    EKG 09/10/17: NSR. LAD. Nonspecific ST and T wave abnormality   Past Medical History:  Diagnosis Date  . Anxiety   . Depression   . Hidradenitis suppurativa   . Tachycardia    "bouts of tachycardia" up to 130-140s, sometimes at rest but usually with anxiety    Past Surgical History:  Procedure Laterality Date  . HYDRADENITIS EXCISION     bilateral axilla  . WISDOM TOOTH EXTRACTION      MEDICATIONS: . citalopram (CELEXA) 20 MG tablet  . clindamycin (CLINDAGEL) 1 % gel  . Eszopiclone 3 MG TABS  . ibuprofen (ADVIL,MOTRIN) 200 MG tablet  . ondansetron (ZOFRAN) 4 MG tablet  . oxyCODONE-acetaminophen (PERCOCET) 5-325 MG per tablet   No current facility-administered medications for this encounter.     If  no changes, I anticipate pt can proceed with surgery as scheduled.   Willeen Cass, FNP-BC [facility] Short Stay [facility]/Anesthesiology Phone: [phone] 09/11/2017 10:12 AM

## 2017-09-12 ENCOUNTER — Encounter (HOSPITAL_COMMUNITY): Payer: Self-pay | Admitting: Urology

## 2017-09-12 ENCOUNTER — Encounter (HOSPITAL_COMMUNITY)
Admission: RE | Disposition: A | Discharge status: Home / Self Care | Payer: Self-pay | Source: Ambulatory Visit | Attending: Surgery

## 2017-09-12 ENCOUNTER — Ambulatory Visit (HOSPITAL_COMMUNITY)
Admission: RE | Admit: 2017-09-12 | Discharge: 2017-09-12 | Disposition: A | Discharge status: Home / Self Care | Payer: No Typology Code available for payment source | Source: Ambulatory Visit | Attending: Surgery | Admitting: Surgery

## 2017-09-12 ENCOUNTER — Ambulatory Visit (HOSPITAL_COMMUNITY): Payer: No Typology Code available for payment source | Admitting: Certified Registered Nurse Anesthetist

## 2017-09-12 ENCOUNTER — Ambulatory Visit (HOSPITAL_COMMUNITY): Payer: No Typology Code available for payment source | Admitting: Vascular Surgery

## 2017-09-12 DIAGNOSIS — Z79899 Other long term (current) drug therapy: Secondary | ICD-10-CM | POA: Insufficient documentation

## 2017-09-12 DIAGNOSIS — Z6836 Body mass index (BMI) 36.0-36.9, adult: Secondary | ICD-10-CM | POA: Insufficient documentation

## 2017-09-12 DIAGNOSIS — F329 Major depressive disorder, single episode, unspecified: Secondary | ICD-10-CM | POA: Insufficient documentation

## 2017-09-12 DIAGNOSIS — F419 Anxiety disorder, unspecified: Secondary | ICD-10-CM | POA: Insufficient documentation

## 2017-09-12 DIAGNOSIS — L732 Hidradenitis suppurativa: Secondary | ICD-10-CM | POA: Insufficient documentation

## 2017-09-12 DIAGNOSIS — E669 Obesity, unspecified: Secondary | ICD-10-CM | POA: Insufficient documentation

## 2017-09-12 HISTORY — PX: HYDRADENITIS EXCISION: SHX5243

## 2017-09-12 LAB — POCT PREGNANCY, URINE: PREG TEST UR: NEGATIVE

## 2017-09-12 SURGERY — EXCISION, HIDRADENITIS, AXILLA
Anesthesia: General | Site: Perineum | Laterality: Left

## 2017-09-12 MED ORDER — MIDAZOLAM HCL 2 MG/2ML IJ SOLN
INTRAMUSCULAR | Status: AC
  Filled 2017-09-12: qty 2

## 2017-09-12 MED ORDER — OXYCODONE HCL 5 MG PO TABS
5.0000 mg | ORAL_TABLET | Freq: Once | ORAL | Status: AC | PRN
  Administered 2017-09-12: 5 mg via ORAL

## 2017-09-12 MED ORDER — BUPIVACAINE-EPINEPHRINE 0.25% -1:200000 IJ SOLN
INTRAMUSCULAR | Status: DC | PRN
  Administered 2017-09-12: 15 mL

## 2017-09-12 MED ORDER — 0.9 % SODIUM CHLORIDE (POUR BTL) OPTIME
TOPICAL | Status: DC | PRN
  Administered 2017-09-12: 500 mL

## 2017-09-12 MED ORDER — MIDAZOLAM HCL 5 MG/5ML IJ SOLN
INTRAMUSCULAR | Status: DC | PRN
  Administered 2017-09-12: 2 mg via INTRAVENOUS

## 2017-09-12 MED ORDER — PROMETHAZINE HCL 25 MG/ML IJ SOLN: 6.2500 mg | INTRAMUSCULAR | Status: DC | PRN

## 2017-09-12 MED ORDER — FENTANYL CITRATE (PF) 100 MCG/2ML IJ SOLN
INTRAMUSCULAR | Status: AC
  Filled 2017-09-12: qty 2

## 2017-09-12 MED ORDER — GABAPENTIN 300 MG PO CAPS
300.0000 mg | ORAL_CAPSULE | ORAL | Status: AC
  Administered 2017-09-12: 300 mg via ORAL

## 2017-09-12 MED ORDER — FENTANYL CITRATE (PF) 250 MCG/5ML IJ SOLN
INTRAMUSCULAR | Status: AC
  Filled 2017-09-12: qty 5

## 2017-09-12 MED ORDER — OXYCODONE HCL 5 MG/5ML PO SOLN: 5.0000 mg | Freq: Once | ORAL | Status: AC | PRN

## 2017-09-12 MED ORDER — OXYCODONE-ACETAMINOPHEN 5-325 MG PO TABS: 1.0000 | ORAL_TABLET | Freq: Four times a day (QID) | ORAL | 0 refills | Status: DC | PRN

## 2017-09-12 MED ORDER — CEFAZOLIN SODIUM-DEXTROSE 2-4 GM/100ML-% IV SOLN
INTRAVENOUS | Status: AC
  Filled 2017-09-12: qty 100

## 2017-09-12 MED ORDER — DEXAMETHASONE SODIUM PHOSPHATE 10 MG/ML IJ SOLN
INTRAMUSCULAR | Status: DC | PRN
  Administered 2017-09-12: 10 mg via INTRAVENOUS

## 2017-09-12 MED ORDER — CELECOXIB 200 MG PO CAPS
200.0000 mg | ORAL_CAPSULE | ORAL | Status: AC
  Administered 2017-09-12: 200 mg via ORAL

## 2017-09-12 MED ORDER — FENTANYL CITRATE (PF) 250 MCG/5ML IJ SOLN
INTRAMUSCULAR | Status: DC | PRN
  Administered 2017-09-12: 50 ug via INTRAVENOUS

## 2017-09-12 MED ORDER — PROPOFOL 500 MG/50ML IV EMUL
INTRAVENOUS | Status: DC | PRN
  Administered 2017-09-12: 75 ug/kg/min via INTRAVENOUS

## 2017-09-12 MED ORDER — PROPOFOL 10 MG/ML IV BOLUS
INTRAVENOUS | Status: DC | PRN
  Administered 2017-09-12: 150 mg via INTRAVENOUS
  Administered 2017-09-12 (×3): 20 mg via INTRAVENOUS
  Administered 2017-09-12: 10 mg via INTRAVENOUS

## 2017-09-12 MED ORDER — ACETAMINOPHEN 500 MG PO TABS
1000.0000 mg | ORAL_TABLET | ORAL | Status: AC
  Administered 2017-09-12: 1000 mg via ORAL

## 2017-09-12 MED ORDER — FENTANYL CITRATE (PF) 100 MCG/2ML IJ SOLN
25.0000 ug | INTRAMUSCULAR | Status: DC | PRN
  Administered 2017-09-12 (×4): 25 ug via INTRAVENOUS

## 2017-09-12 MED ORDER — CEFAZOLIN SODIUM-DEXTROSE 2-4 GM/100ML-% IV SOLN
2.0000 g | INTRAVENOUS | Status: AC
  Administered 2017-09-12: 2 g via INTRAVENOUS

## 2017-09-12 MED ORDER — DOCUSATE SODIUM 100 MG PO CAPS: 100.0000 mg | ORAL_CAPSULE | Freq: Two times a day (BID) | ORAL | 0 refills | Status: AC

## 2017-09-12 MED ORDER — LACTATED RINGERS IV SOLN
INTRAVENOUS | Status: DC
  Administered 2017-09-12: 10:00:00 via INTRAVENOUS

## 2017-09-12 MED ORDER — EPHEDRINE SULFATE-NACL 50-0.9 MG/10ML-% IV SOSY
PREFILLED_SYRINGE | INTRAVENOUS | Status: DC | PRN
  Administered 2017-09-12: 10 mg via INTRAVENOUS

## 2017-09-12 MED ORDER — CELECOXIB 200 MG PO CAPS
ORAL_CAPSULE | ORAL | Status: AC
  Administered 2017-09-12: 200 mg via ORAL
  Filled 2017-09-12: qty 1

## 2017-09-12 MED ORDER — CHLORHEXIDINE GLUCONATE 4 % EX LIQD: 60.0000 mL | Freq: Once | CUTANEOUS | Status: DC

## 2017-09-12 MED ORDER — BUPIVACAINE-EPINEPHRINE (PF) 0.25% -1:200000 IJ SOLN
INTRAMUSCULAR | Status: AC
  Filled 2017-09-12: qty 30

## 2017-09-12 MED ORDER — ONDANSETRON HCL 4 MG/2ML IJ SOLN
INTRAMUSCULAR | Status: DC | PRN
  Administered 2017-09-12: 4 mg via INTRAVENOUS

## 2017-09-12 MED ORDER — GABAPENTIN 300 MG PO CAPS
ORAL_CAPSULE | ORAL | Status: AC
  Administered 2017-09-12: 300 mg via ORAL
  Filled 2017-09-12: qty 1

## 2017-09-12 MED ORDER — ACETAMINOPHEN 500 MG PO TABS
ORAL_TABLET | ORAL | Status: AC
  Administered 2017-09-12: 1000 mg via ORAL
  Filled 2017-09-12: qty 2

## 2017-09-12 MED ORDER — OXYCODONE HCL 5 MG PO TABS
ORAL_TABLET | ORAL | Status: AC
  Filled 2017-09-12: qty 1

## 2017-09-12 MED FILL — CITALOPRAM HBR 20 MG TABLET: 20 | 30 days supply | Qty: 30 | Fill #1

## 2017-09-12 SURGICAL SUPPLY — 31 items
BLADE CLIPPER SURG (BLADE) IMPLANT
BNDG GAUZE ELAST 4 BULKY (GAUZE/BANDAGES/DRESSINGS) IMPLANT
CANISTER SUCT 3000ML PPV (MISCELLANEOUS) ×1 IMPLANT
COVER SURGICAL LIGHT HANDLE (MISCELLANEOUS) ×3 IMPLANT
DRAPE LAPAROSCOPIC ABDOMINAL (DRAPES) IMPLANT
DRAPE LAPAROTOMY 100X72 PEDS (DRAPES) ×2 IMPLANT
DRSG PAD ABDOMINAL 8X10 ST (GAUZE/BANDAGES/DRESSINGS) IMPLANT
ELECT CAUTERY BLADE 6.4 (BLADE) ×1 IMPLANT
ELECT REM PT RETURN 9FT ADLT (ELECTROSURGICAL) ×3
ELECTRODE REM PT RTRN 9FT ADLT (ELECTROSURGICAL) ×1 IMPLANT
GAUZE SPONGE 4X4 12PLY STRL (GAUZE/BANDAGES/DRESSINGS) ×2 IMPLANT
GLOVE BIO SURGEON STRL SZ 6 (GLOVE) ×7 IMPLANT
GLOVE INDICATOR 6.5 STRL GRN (GLOVE) ×7 IMPLANT
GOWN STRL REUS W/ TWL LRG LVL3 (GOWN DISPOSABLE) ×2 IMPLANT
GOWN STRL REUS W/TWL LRG LVL3 (GOWN DISPOSABLE) ×9
KIT BASIN OR (CUSTOM PROCEDURE TRAY) ×3 IMPLANT
KIT TURNOVER KIT B (KITS) ×3 IMPLANT
NS IRRIG 1000ML POUR BTL (IV SOLUTION) ×3 IMPLANT
PACK SURGICAL SETUP 50X90 (CUSTOM PROCEDURE TRAY) ×3 IMPLANT
PAD ABD 8X10 STRL (GAUZE/BANDAGES/DRESSINGS) ×2 IMPLANT
PAD ARMBOARD 7.5X6 YLW CONV (MISCELLANEOUS) ×3 IMPLANT
PENCIL BUTTON HOLSTER BLD 10FT (ELECTRODE) ×1 IMPLANT
SUT MNCRL AB 4-0 PS2 18 (SUTURE) ×2 IMPLANT
SUT VIC AB 3-0 SH 8-18 (SUTURE) ×2 IMPLANT
SWAB COLLECTION DEVICE MRSA (MISCELLANEOUS) IMPLANT
SWAB CULTURE ESWAB REG 1ML (MISCELLANEOUS) IMPLANT
TOWEL OR 17X24 6PK STRL BLUE (TOWEL DISPOSABLE) ×3 IMPLANT
TOWEL OR 17X26 10 PK STRL BLUE (TOWEL DISPOSABLE) ×1 IMPLANT
TUBE CONNECTING 12'X1/4 (SUCTIONS) ×1
TUBE CONNECTING 12X1/4 (SUCTIONS) ×2 IMPLANT
YANKAUER SUCT BULB TIP NO VENT (SUCTIONS) ×3 IMPLANT

## 2017-09-12 NOTE — Anesthesia Procedure Notes (Signed)
Procedure Name: LMA Insertion Date/Time: 09/12/2017 11:33 AM Performed by: Glynda Jaeger, CRNA Pre-anesthesia Checklist: Patient identified, Emergency Drugs available, Suction available and Patient being monitored Patient Re-evaluated:Patient Re-evaluated prior to induction Oxygen Delivery Method: Circle System Utilized Preoxygenation: Pre-oxygenation with 100% oxygen Induction Type: IV induction Ventilation: Mask ventilation without difficulty LMA: LMA inserted LMA Size: 4.0 Number of attempts: 1 Airway Equipment and Method: Bite block Placement Confirmation: positive ETCO2 Tube secured with: Tape Dental Injury: Teeth and Oropharynx as per pre-operative assessment

## 2017-09-12 NOTE — Transfer of Care (Signed)
Immediate Anesthesia Transfer of Care Note  Patient: Kristen David  Procedure(s) Performed: EXCISION OF LEFT INNER THIGH HIDRADENITIS (Left Perineum)  Patient Location: PACU  Anesthesia Type:General  Level of Consciousness: awake, alert , oriented, patient cooperative and responds to stimulation  Airway & Oxygen Therapy: Patient Spontanous Breathing and Patient connected to face mask oxygen  Post-op Assessment: Report given to RN, Post -op Vital signs reviewed and stable and Patient moving all extremities X 4  Post vital signs: Reviewed and stable  Last Vitals:  Vitals Value Taken Time  BP 129/82 09/12/2017 12:28 PM  Temp    Pulse 100 09/12/2017 12:28 PM  Resp 23 09/12/2017 12:28 PM  SpO2 87 % 09/12/2017 12:28 PM  Vitals shown include unvalidated device data.  Last Pain:  Vitals:   09/12/17 1000  TempSrc:   PainSc: 0-No pain         Complications: No apparent anesthesia complications

## 2017-09-12 NOTE — Anesthesia Postprocedure Evaluation (Signed)
Anesthesia Post Note  Patient: Kristen David  Procedure(s) Performed: EXCISION OF LEFT INNER THIGH HIDRADENITIS (Left Perineum)     Patient location during evaluation: PACU Anesthesia Type: General Level of consciousness: awake and alert and awake Pain management: pain level controlled Vital Signs Assessment: post-procedure vital signs reviewed and stable Respiratory status: spontaneous breathing, nonlabored ventilation and respiratory function stable Cardiovascular status: blood pressure returned to baseline and stable Postop Assessment: no apparent nausea or vomiting Anesthetic complications: no    Last Vitals:  Vitals:   09/12/17 1258 09/12/17 1315  BP: 124/80 117/75  Pulse: 82 91  Resp: 13 15  Temp:    SpO2: 91% 94%    Last Pain:  Vitals:   09/12/17 1315  TempSrc:   PainSc: 3                  Audry Pili

## 2017-09-12 NOTE — Op Note (Signed)
Operative Note  Kristen David  [id number]  [id number]  09/12/2017   Surgeon: Vikki Ports A ConnorMD  Assistant: none  Procedure performed: Excision of left groin and perineal hidradenitis 15x5cm  Preop diagnosis: Hidradenitis  Post-op diagnosis/intraop findings: same  Specimens: Left groin hidradenitis Retained items: no EBL: minimal cc Complications: none  Description of procedure: After obtaining informed consent the patient was taken to the operating room and placed supine on operating room table [facility] was initiated, preoperative antibiotics were administered, SCDs applied, and a formal timeout was performed. Patient was placed in dorsal lithotomy position with all pressure points appropriately padded and the bilateral groins, upper thighs and perineum were prepped with Betadine and draped in the usual sterile fashion. Infiltration with local was undertaken in the subcutaneous tissue and skin surrounding the intended site of excision in the left upper thigh/perineum. The patient really was not able to tolerate Mac and therefore she was converted to general anesthesia with an LMA. A 15 blade was then used to incise a curved ellipse incision around the region of the most prominent hidradenitis which included the proximal medial left thigh and a small amount of skin from the left labia and perineum, although she had lesions throughout the perineum, perianal region, and bilateral labia. Cautery was then used to dissect out the skin and soft tissue to remove the most prominent tracks. This was sent for pathology. Hemostasis was ensured within the wound which measured approximately 15 cm long by 5 cm wide and forms a curved ellipse extending onto the medial left thigh. The skin edges were reapproximated with interrupted deep dermal 30 Vicryls followed by running subcuticular Monocryl. A dry dressing was then applied. The patient was then returned to the supine position, awakened, extubated  and taken to PACU in stable condition.   All counts were correct at the completion of the case.

## 2017-09-12 NOTE — Interval H&P Note (Signed)
History and Physical Interval Note:  09/12/2017 10:33 AM  Kristen David  has presented today for surgery, with the diagnosis of Hidradenitis  The various methods of treatment have been discussed with the patient and family. After consideration of risks, benefits and other options for treatment, the patient has consented to  Procedure(s): EXCISION OF LEFT INNER THIGH HIDRADENITIS (Left) as a surgical intervention .  The patient's history has been reviewed, patient examined, no change in status, stable for surgery.  I have reviewed the patient's chart and labs.  Questions were answered to the patient's satisfaction.     Chelsea Rich Brave

## 2017-09-12 NOTE — Discharge Instructions (Signed)
GENERAL SURGERY: POST OP INSTRUCTIONS  ######################################################################  EAT Gradually transition to a high fiber diet with a fiber supplement over the next few weeks after discharge.  Start with a pureed / full liquid diet (see below)  WALK Walk an hour a day.  Control your pain to do that.    CONTROL PAIN Control pain so that you can walk, sleep, tolerate sneezing/coughing, go up/down stairs.  HAVE A BOWEL MOVEMENT DAILY Keep your bowels regular to avoid problems.  OK to try a laxative to override constipation.  OK to use an antidairrheal to slow down diarrhea.  Call if not better after 2 tries  CALL IF YOU HAVE PROBLEMS/CONCERNS Call if you are still struggling despite following these instructions. Call if you have concerns not answered by these instructions  ######################################################################    1. DIET: Follow a light bland diet the first 24 hours after arrival home, such as soup, liquids, crackers, etc.  Be sure to include lots of fluids daily.  Avoid fast food or heavy meals as your are more likely to get nauseated.   2. Take your usually prescribed home medications unless otherwise directed. 3. PAIN CONTROL: a. Pain is best controlled by a usual combination of three different methods TOGETHER: i. Ice/Heat ii. Over the counter pain medication iii. Prescription pain medication b. Most patients will experience some swelling and bruising around the incisions.  Ice packs or heating pads (30-60 minutes up to 6 times a day) will help. Use ice for the first few days to help decrease swelling and bruising, then switch to heat to help relax tight/sore spots and speed recovery.  Some people prefer to use ice alone, heat alone, alternating between ice & heat.  Experiment to what works for you.  Swelling and bruising can take several weeks to resolve.   c. It is helpful to take an over-the-counter pain medication  regularly for the first few weeks.  Choose one of the following that works best for you: i. Naproxen (Aleve, etc)  Two 220mg  tabs twice a day ii. Ibuprofen (Advil, etc) Three 200mg  tabs four times a day (every meal & bedtime) iii. Acetaminophen (Tylenol, etc) 500-650mg  four times a day (every meal & bedtime) d. A  prescription for pain medication (such as oxycodone, hydrocodone, etc) should be given to you upon discharge.  Take your pain medication as prescribed.  i. If you are having problems/concerns with the prescription medicine (does not control pain, nausea, vomiting, rash, itching, etc), please call us [phone] to see if we need to switch you to a different pain medicine that will work better for you and/or control your side effect better. ii. If you need a refill on your pain medication, please contact your pharmacy.  They will contact our office to request authorization. Prescriptions will not be filled after 5 pm or on week-ends. 4. Avoid getting constipated.  Between the surgery and the pain medications, it is common to experience some constipation.  Increasing fluid intake and taking a fiber supplement (such as Metamucil, Citrucel, FiberCon, MiraLax, etc) 1-2 times a day regularly will usually help prevent this problem from occurring.  A mild laxative (prune juice, Milk of Magnesia, MiraLax, etc) should be taken according to package directions if there are no bowel movements after 48 hours.   5. Wash / shower every day starting post-op day 2. Let the soap and water run over the incision and pat dry. OK to leave the incision open to air or cover with  a dry gauze dressing to protect clothing. No rubbing, scrubbing, lotions or ointments to incision. No soaking or swimming until after your follow up.     6. ACTIVITIES as tolerated:   a. You may resume regular (light) daily activities beginning the next day--such as daily self-care, walking, climbing stairs--gradually increasing activities  as tolerated.  If you can walk 30 minutes without difficulty, it is safe to try more intense activity such as jogging, treadmill, bicycling, low-impact aerobics, swimming, etc. b. Save the most intensive and strenuous activity for last such as sit-ups, heavy lifting, contact sports, etc  Refrain from any heavy lifting or straining until you are off narcotics for pain control.   c. DO NOT PUSH THROUGH PAIN.  Let pain be your guide: If it hurts to do something, don't do it.  Pain is your body warning you to avoid that activity for another week until the pain goes down. d. You may drive when you are no longer taking prescription pain medication, you can comfortably wear a seatbelt, and you can safely maneuver your car and apply brakes. e. Dennis Bast may have sexual intercourse when it is comfortable.  7. FOLLOW UP in our office a. Please call [facility] at [phone] to set up an appointment to see your surgeon in the office for a follow-up appointment approximately 2-3 weeks after your surgery. b. Make sure that you call for this appointment the day you arrive home to insure a convenient appointment time. 9. IF YOU HAVE DISABILITY OR FAMILY LEAVE FORMS, BRING THEM TO THE OFFICE FOR PROCESSING.  DO NOT GIVE THEM TO YOUR DOCTOR.   WHEN TO CALL us [phone]: 1. Poor pain control 2. Reactions / problems with new medications (rash/itching, nausea, etc)  3. Fever over 101.5 F (38.5 C) 4. Worsening swelling or bruising 5. Continued bleeding from incision. 6. Increased pain, redness, or drainage from the incision 7. Difficulty breathing / swallowing   The clinic staff is available to answer your questions during regular business hours (8:30am-5pm).  Please dont hesitate to call and ask to speak to one of our nurses for clinical concerns.   If you have a medical emergency, go to the nearest emergency room or call 911.  A surgeon from [facility] is always on call at the [facility], [address], [place], [place], [place]  [address] ? MAIN: [phone] ? TOLL FREE: [phone] ?  FAX [phone] [email or web address]  Oxy 5mg  received in Recovery at 1230 PM, next pain med dose available at 630 PM if needed.   Post Anesthesia Home Care Instructions  Activity: Get plenty of rest for the remainder of the day. A responsible individual must stay with you for 24 hours following the procedure.  For the next 24 hours, DO NOT: -Drive a car -Paediatric nurse -Drink alcoholic beverages -Take any medication unless instructed by your physician -Make any legal decisions or sign important papers.  Meals: Start with liquid foods such as gelatin or soup. Progress to regular foods as tolerated. Avoid greasy, spicy, heavy foods. If nausea and/or vomiting occur, drink only clear liquids until the nausea and/or vomiting subsides. Call your physician if vomiting continues.  Special Instructions/Symptoms: Your throat may feel dry or sore from the anesthesia or the breathing tube placed in your throat during surgery. If this causes discomfort, gargle with warm salt water. The discomfort should disappear within 24 hours.  If you had a scopolamine patch  placed behind your ear for the management of post- operative nausea and/or vomiting:  1. The medication in the patch is effective for 72 hours, after which it should be removed.  Wrap patch in a tissue and discard in the trash. Wash hands thoroughly with soap and water. 2. You may remove the patch earlier than 72 hours if you experience unpleasant side effects which may include dry mouth, dizziness or visual disturbances. 3. Avoid touching the patch. Wash your hands with soap and water after contact with the patch.

## 2017-09-12 NOTE — Anesthesia Preprocedure Evaluation (Addendum)
Anesthesia Evaluation  Patient identified by MRN, date of birth, ID band Patient awake    Reviewed: Allergy & Precautions, NPO status , Patient's Chart, lab work & pertinent test results  History of Anesthesia Complications Negative for: history of anesthetic complications  Airway Mallampati: II  TM Distance: >3 FB Neck ROM: Full    Dental  (+) Dental Advisory Given, Teeth Intact   Pulmonary neg pulmonary ROS,    breath sounds clear to auscultation       Cardiovascular negative cardio ROS   Rhythm:Regular Rate:Normal     Neuro/Psych Anxiety Depression negative neurological ROS     GI/Hepatic negative GI ROS, Neg liver ROS,   Endo/Other   Obesity   Renal/GU negative Renal ROS  negative genitourinary   Musculoskeletal negative musculoskeletal ROS (+)   Abdominal (+) + obese,   Peds  Hematology negative hematology ROS (+)   Anesthesia Other Findings Hidradenitis suppurativa  Reproductive/Obstetrics                            Anesthesia Physical Anesthesia Plan  ASA: II  Anesthesia Plan: General   Post-op Pain Management:    Induction: Intravenous  PONV Risk Score and Plan: 2 and Treatment may vary due to age or medical condition, Ondansetron and Dexamethasone  Airway Management Planned: LMA  Additional Equipment: None  Intra-op Plan:   Post-operative Plan: Extubation in OR  Informed Consent: I have reviewed the patients History and Physical, chart, labs and discussed the procedure including the risks, benefits and alternatives for the proposed anesthesia with the patient or authorized representative who has indicated his/her understanding and acceptance.   Dental advisory given  Plan Discussed with: CRNA and Anesthesiologist  Anesthesia Plan Comments:        Anesthesia Quick Evaluation

## 2017-09-13 ENCOUNTER — Encounter (HOSPITAL_COMMUNITY): Payer: Self-pay | Admitting: Surgery

## 2017-10-25 ENCOUNTER — Encounter: Payer: Self-pay | Admitting: Nurse Practitioner

## 2017-11-13 ENCOUNTER — Encounter: Payer: Self-pay | Admitting: Nurse Practitioner

## 2017-11-13 ENCOUNTER — Ambulatory Visit (INDEPENDENT_AMBULATORY_CARE_PROVIDER_SITE_OTHER): Payer: No Typology Code available for payment source | Admitting: Nurse Practitioner

## 2017-11-13 VITALS — BP 110/70 | HR 78 | Temp 98.2°F | Ht 60.5 in | Wt 194.8 lb

## 2017-11-13 DIAGNOSIS — G4700 Insomnia, unspecified: Secondary | ICD-10-CM

## 2017-11-13 DIAGNOSIS — F3289 Other specified depressive episodes: Secondary | ICD-10-CM | POA: Diagnosis not present

## 2017-11-13 DIAGNOSIS — Z6835 Body mass index (BMI) 35.0-35.9, adult: Secondary | ICD-10-CM | POA: Diagnosis not present

## 2017-11-13 DIAGNOSIS — E669 Obesity, unspecified: Secondary | ICD-10-CM

## 2017-11-13 LAB — POCT URINE PREGNANCY: Preg Test, Ur: NEGATIVE

## 2017-11-13 MED ORDER — CITALOPRAM HYDROBROMIDE 20 MG PO TABS: 20.0000 mg | ORAL_TABLET | Freq: Every day | ORAL | 1 refills | Status: DC

## 2017-11-13 MED ORDER — PHENTERMINE HCL 15 MG PO CAPS: 15.0000 mg | ORAL_CAPSULE | ORAL | 1 refills | Status: DC

## 2017-11-13 MED ORDER — ESZOPICLONE 3 MG PO TABS
3.0000 mg | ORAL_TABLET | Freq: Every evening | ORAL | 3 refills | Status: DC | PRN
Start: 2017-11-13 — End: 2018-03-17

## 2017-11-13 MED FILL — ESZOPICLONE 3 MG TABS: 3 | 30 days supply | Qty: 30 | Fill #0

## 2017-11-13 MED FILL — PHENTERMINE 15 MG CAPSULE: 15 | 30 days supply | Qty: 30 | Fill #0

## 2017-11-13 NOTE — Progress Notes (Signed)
Subjective:     Patient ID: Kristen David , female    DOB: [birth year] , 37 y.o.   MRN: [id number]   Obesity - She had been out of work for approximately 3 weeks and had a significant weight gain.  She is seeing a trainer 2-3 days per week beginning last week.  She is also doing cardio an additional 1-2 times per week.  Diet - low carb. Has tried Phentermine and Qsymia.    June weight was 180.6 now 194 lbs   Depression       The patient presents with depression.  This is a chronic problem.  The current episode started 1 to 4 weeks ago.   The onset quality is gradual.   Associated symptoms include insomnia.     The symptoms are aggravated by social issues.  Past treatments include SSRIs - Selective serotonin reuptake inhibitors (citalopram at 20 mg will have bouts of anxiety with restless nights.).  Compliance with treatment is good.  Past compliance problems include medical issues.  Past medical history includes recent illness and depression.    (Recent surgery for her hidradenitis - wound dehisced - continues to see surgery for this. ) Insomnia  Primary symptoms: fragmented sleep, difficulty falling asleep, frequent awakening.  The current episode started more than one month. The onset quality is gradual. The problem occurs every several days. The problem has been waxing and waning since onset. The symptoms are aggravated by anxiety. How many drinks containing alcohol do you have on a typical day when you are drinking: none.  Past treatments include meditation. The treatment provided no relief. Typical bedtime:  10-11 P.M..  How long after going to bed to you fall asleep: over an hour.   Sleep duration: when she is off work if she has slept well.  Keeping busy during the day.  PMH includes: depression.     Past Medical History:  Diagnosis Date  . Anxiety   . Depression   . Hidradenitis suppurativa   . Tachycardia    "bouts of tachycardia" up to 130-140s, sometimes at rest but usually with  anxiety      Current Outpatient Medications:  .  citalopram (CELEXA) 20 MG tablet, Take 20 mg by mouth at bedtime. , Disp: , Rfl:  .  Eszopiclone 3 MG TABS, Take 3 mg by mouth at bedtime as needed (for sleep). Take immediately before bedtime , Disp: , Rfl:  .  ibuprofen (ADVIL,MOTRIN) 200 MG tablet, Take 800 mg by mouth every 6 (six) hours as needed for headache or moderate pain., Disp: , Rfl:  .  magnesium gluconate (MAGONATE) 500 MG tablet, Take 500 mg by mouth daily., Disp: , Rfl:    Review of Systems  Psychiatric/Behavioral: Positive for depression. The patient has insomnia.      Today's Vitals   11/13/17 1043  BP: 110/70  Pulse: 78  Temp: 98.2 F (36.8 C)  TempSrc: Oral  SpO2: 98%  Weight: 194 lb 12.8 oz (88.4 kg)  Height: 5' 0.5" (1.537 m)  PainSc: 0-No pain   Body mass index is 37.42 kg/m.   Objective:  Physical Exam  Constitutional: She is oriented to person, place, and time. She appears well-developed and well-nourished.  Obese  Cardiovascular: Normal rate, regular rhythm, normal heart sounds and intact distal pulses.  Pulmonary/Chest: Effort normal and breath sounds normal.  Neurological: She is oriented to person, place, and time.  Skin: Skin is warm and dry. Capillary refill takes less than 2  seconds.  Psychiatric: She has a normal mood and affect.        Assessment And Plan:     1. Other depression  Chronic  This is ongoing and I recommend she go to counseling.    Continue with current medications  2. Insomnia, unspecified type  Chronic,  Continue with current medications  3. Obesity (BMI 35.0-39.9 without comorbidity)  Chronic  Discussed the importance of healthy diet and regular exercise this will help with her mood as well.  Negative urine pregnancy   Will try phentermine for a limited period of time. - phentermine 15 MG capsule; Take 1 capsule (15 mg total) by mouth every morning.  Dispense: 30 capsule; Refill: 1 - POCT Urine  Pregnancy  Minette Brine, FNP

## 2017-11-13 NOTE — Patient Instructions (Signed)
How to Increase Your Level of Physical Activity Getting regular physical activity is important for your overall health and well-being. Most people do not get enough exercise. There are easy ways to increase your level of physical activity, even if you have not been very active in the past or you are just starting out. Why is physical activity important? Physical activity has many short-term and long-term health benefits. Regular exercise can:  Help you lose weight or maintain a healthy weight.  Strengthen your muscles and bones.  Boost your mood and improve self-esteem.  Reduce your risk of certain long-term (chronic) diseases, like heart disease, cancer, and diabetes.  Help you stay capable of walking and moving around (mobile) as you age.  Prevent accidents, such as falls, as you age.  Increase life expectancy.  What are the benefits of being physically active on a regular basis? In addition to improving your physical health, being physically active on most days of the week can help you in ways that you may not expect. Benefits of regular physical activity may include:  Feeling good about your body.  Being able to move around more easily and for longer periods of time without getting tired (increased stamina).  Finding new sources of fun and enjoyment.  Meeting new people who share a common interest.  Being able to fight off illness better (enhanced immunity).  Being able to sleep better.  What can happen if I am not physically active on a regular basis? Not getting enough physical activity can lead to an unhealthy lifestyle and future health problems. This can increase your chances of:  Becoming overweight or obese.  Becoming sick.  Developing chronic illnesses, like heart disease or diabetes.  Having mental health problems, like depression or anxiety.  Having sleep problems.  Having trouble walking or getting yourself around (reduced mobility).  Injuring yourself  in a fall as you get older.  What steps can I take to be more physically active?  Check with your health care provider about how to get started. Ask your health care provider what activities are safe for you.  Start out slowly. Walking or doing some simple chair exercises is a good place to start, especially if you have not been active before or for a long time.  Try to find activities that you enjoy. You are more likely to commit to an exercise routine if it does not feel like a chore.  If you have bone or joint problems, choose low-impact exercises, like walking or swimming.  Include physical activity in your everyday routine.  Invite friends or family members to exercise with you. This also will help you commit to your workout plan.  Set goals that you can work toward.  Aim for at least 150 minutes of moderate-intensity exercise each week. Examples of moderate-intensity exercise include walking or riding a bike. Where to find more information:  Centers for Disease Control and Prevention: [employer].is  President's [employer] on [employer], Sports & Nutrition www.http://villegas.org/  ChooseMyPlate: [employer] Contact a health care provider if:  You have headaches, muscle aches, or joint pain.  You feel dizzy or light-headed while exercising.  You faint.  You have chest pain while exercising. Summary  Exercise benefits your mind and body at any age, even if you are just starting out.  If you have a chronic illness or have not been active for a while, check with your health care provider before increasing your physical activity.  Choose activities that are safe and enjoyable  for you.Ask your health care provider what activities are safe for you.  Start slowly. Tell your health care provider if you have problems as you start to increase your activity level. This information is not intended to replace advice given to  you by your health care provider. Make sure you discuss any questions you have with your health care provider. Document Released: 01/11/2016 Document Revised: 01/11/2016 Document Reviewed: 01/11/2016 Elsevier Interactive Patient Education  2018 [employer].  Obesity, Adult Obesity is having too much body fat. If you have a BMI of 30 or more, you are obese. BMI is a number that explains how much body fat you have. Obesity is often caused by taking in (consuming) more calories than your body uses. Obesity can cause serious health problems. Changing your lifestyle can help to treat obesity. Follow these instructions at home: Eating and drinking   Follow advice from your doctor about what to eat and drink. Your doctor may tell you to: ? Cut down on (limit) fast foods, sweets, and processed snack foods. ? Choose low-fat options. For example, choose low-fat milk instead of whole milk. ? Eat 5 or more servings of fruits or vegetables every day. ? Eat at home more often. This gives you more control over what you eat. ? Choose healthy foods when you eat out. ? Learn what a healthy portion size is. A portion size is the amount of a certain food that is healthy for you to eat at one time. This is different for each person. ? Keep low-fat snacks available. ? Avoid sugary drinks. These include soda, fruit juice, iced tea that is sweetened with sugar, and flavored milk. ? Eat a healthy breakfast.  Drink enough water to keep your pee (urine) clear or pale yellow.  Do not go without eating for long periods of time (do not fast).  Do not go on popular or trendy diets (fad diets). Physical Activity  Exercise often, as told by your doctor. Ask your doctor: ? What types of exercise are safe for you. ? How often you should exercise.  Warm up and stretch before being active.  Do slow stretching after being active (cool down).  Rest between times of being active. Lifestyle  Limit how much time you  spend in front of your TV, computer, or video game system (be less sedentary).  Find ways to reward yourself that do not involve food.  Limit alcohol intake to no more than 1 drink a day for nonpregnant women and 2 drinks a day for men. One drink equals 12 oz of beer, 5 oz of wine, or 1 oz of hard liquor. General instructions  Keep a weight loss journal. This can help you keep track of: ? The food that you eat. ? The exercise that you do.  Take over-the-counter and prescription medicines only as told by your doctor.  Take vitamins and supplements only as told by your doctor.  Think about joining a support group. Your doctor may be able to help with this.  Keep all follow-up visits as told by your doctor. This is important. Contact a doctor if:  You cannot meet your weight loss goal after you have changed your diet and lifestyle for 6 weeks. This information is not intended to replace advice given to you by your health care provider. Make sure you discuss any questions you have with your health care provider. Document Released: 04/15/2011 Document Revised: 06/29/2015 Document Reviewed: 11/09/2014 Elsevier Interactive Patient Education  2018 Elsevier  Inc.  

## 2017-11-25 ENCOUNTER — Ambulatory Visit (INDEPENDENT_AMBULATORY_CARE_PROVIDER_SITE_OTHER): Payer: No Typology Code available for payment source | Admitting: Psychology

## 2017-11-25 DIAGNOSIS — F330 Major depressive disorder, recurrent, mild: Secondary | ICD-10-CM | POA: Diagnosis not present

## 2017-11-27 ENCOUNTER — Other Ambulatory Visit: Payer: Self-pay | Admitting: Nurse Practitioner

## 2017-12-05 MED FILL — CITALOPRAM HBR 20 MG TABLET: 20 | 90 days supply | Qty: 90 | Fill #0

## 2017-12-08 ENCOUNTER — Ambulatory Visit: Payer: No Typology Code available for payment source | Admitting: Psychology

## 2017-12-11 MED FILL — ESZOPICLONE 3 MG TABS: 3 | 30 days supply | Qty: 30 | Fill #1

## 2018-01-09 MED FILL — ESZOPICLONE 3 MG TABS: 3 | 30 days supply | Qty: 30 | Fill #2

## 2018-01-15 ENCOUNTER — Ambulatory Visit (INDEPENDENT_AMBULATORY_CARE_PROVIDER_SITE_OTHER): Payer: No Typology Code available for payment source | Admitting: Nurse Practitioner

## 2018-01-15 ENCOUNTER — Encounter: Payer: Self-pay | Admitting: Nurse Practitioner

## 2018-01-15 ENCOUNTER — Other Ambulatory Visit: Payer: Self-pay

## 2018-01-15 VITALS — BP 150/98 | HR 77 | Temp 97.7°F | Wt 200.6 lb

## 2018-01-15 DIAGNOSIS — Z6838 Body mass index (BMI) 38.0-38.9, adult: Secondary | ICD-10-CM

## 2018-01-15 DIAGNOSIS — I10 Essential (primary) hypertension: Secondary | ICD-10-CM | POA: Diagnosis not present

## 2018-01-15 DIAGNOSIS — F329 Major depressive disorder, single episode, unspecified: Secondary | ICD-10-CM

## 2018-01-15 DIAGNOSIS — E6609 Other obesity due to excess calories: Secondary | ICD-10-CM | POA: Diagnosis not present

## 2018-01-15 DIAGNOSIS — F32A Depression, unspecified: Secondary | ICD-10-CM

## 2018-01-15 DIAGNOSIS — N946 Dysmenorrhea, unspecified: Secondary | ICD-10-CM | POA: Diagnosis not present

## 2018-01-15 MED ORDER — LIRAGLUTIDE -WEIGHT MANAGEMENT 18 MG/3ML ~~LOC~~ SOPN: 3.0000 mg | PEN_INJECTOR | SUBCUTANEOUS | 1 refills | Status: DC

## 2018-01-15 MED ORDER — TRAMADOL HCL 50 MG PO TABS: 50.0000 mg | ORAL_TABLET | Freq: Four times a day (QID) | ORAL | 0 refills | Status: DC | PRN

## 2018-01-15 MED ORDER — VORTIOXETINE HBR 5 MG PO TABS: 5.0000 mg | ORAL_TABLET | Freq: Every day | ORAL | 2 refills | Status: DC

## 2018-01-15 MED FILL — TRINTELLIX 5 MG TABLET: 5 | 30 days supply | Qty: 30 | Fill #0

## 2018-01-15 MED FILL — traMADol HCL 50 MG TABS: 50 | 5 days supply | Qty: 20 | Fill #0

## 2018-01-15 NOTE — Progress Notes (Addendum)
Subjective:     Patient ID: Kristen David , female    DOB: [birth year] , 37 y.o.   MRN: [id number]   Chief Complaint  Patient presents with  . Weight Check  . Dysmenorrhea    cramping- back aches- headaches  . Depression    HPI  Menstrual cramping - now having worsening cramps, doubled over in pain.  First day of cycle started yesterday evening.  She is regular with her cycle.  She has tried Naproxen but does not work any better.  She feels like it disables her from being able to work.    Depression       The patient presents with depression.  This is a chronic problem.  Associated symptoms include irritable.  Associated symptoms include no decreased concentration, no fatigue, no restlessness, no appetite change and no headaches.     Exacerbated by: menstrual cycle worsens.  Past treatments include SSRIs - Selective serotonin reuptake inhibitors.  Compliance with treatment is good.  Past medical history includes depression.     Pertinent negatives include no chronic fatigue syndrome, no chronic pain and no anxiety.    Past Medical History:  Diagnosis Date  . Anxiety   . Depression   . Hidradenitis suppurativa   . Tachycardia    "bouts of tachycardia" up to 130-140s, sometimes at rest but usually with anxiety     Family History  Problem Relation Age of Onset  . Diabetes Mother   . Cancer Mother        Breast     Current Outpatient Medications:  .  citalopram (CELEXA) 20 MG tablet, Take 1 tablet (20 mg total) by mouth at bedtime., Disp: 90 tablet, Rfl: 1 .  Eszopiclone 3 MG TABS, Take 1 tablet (3 mg total) by mouth at bedtime as needed (for sleep). Take immediately before bedtime, Disp: 30 tablet, Rfl: 3 .  ibuprofen (ADVIL,MOTRIN) 200 MG tablet, Take 800 mg by mouth every 6 (six) hours as needed for headache or moderate pain., Disp: , Rfl:  .  magnesium gluconate (MAGONATE) 500 MG tablet, Take 500 mg by mouth daily., Disp: , Rfl:  .  phentermine 15 MG capsule, Take 1  capsule (15 mg total) by mouth every morning., Disp: 30 capsule, Rfl: 1   Allergies  Allergen Reactions  . Tramadol Nausea And Vomiting     Review of Systems  Constitutional: Negative for appetite change, chills, fatigue and fever.  Eyes: Negative for photophobia.  Cardiovascular: Negative.   Gastrointestinal: Negative for abdominal distention, diarrhea and nausea.  Endocrine: Negative for polydipsia, polyphagia and polyuria.  Skin: Negative.   Neurological: Negative for dizziness and headaches.  Psychiatric/Behavioral: Positive for depression. Negative for agitation, behavioral problems, decreased concentration and sleep disturbance. The patient is not nervous/anxious.        No desire to do anything.       Today's Vitals   01/15/18 1015  BP: (!) 150/98  Pulse: 77  Temp: 97.7 F (36.5 C)  TempSrc: Oral  SpO2: 98%  Weight: 200 lb 9.6 oz (91 kg)   Body mass index is 38.53 kg/m.   Objective:  Physical Exam Constitutional:      General: She is irritable.     Appearance: Normal appearance. She is obese.  Cardiovascular:     Rate and Rhythm: Normal rate.  Pulmonary:     Effort: Pulmonary effort is normal.     Breath sounds: Normal breath sounds.  Skin:    General: Skin is  warm and dry.     Capillary Refill: Capillary refill takes less than 2 seconds.  Neurological:     General: No focal deficit present.     Mental Status: She is alert.  Psychiatric:        Attention and Perception: Attention normal.        Mood and Affect: Affect is flat and tearful.        Speech: Speech normal.        Judgment: Judgment normal.         Assessment And Plan:     1. Dysmenorrhea  Chronic, worsening with each month  She is having severe cramps  She is interested in any options to help with this discomfort, has tried ibuprofen and Naproxen with minimal relief - Ambulatory referral to Gynecology - traMADol (ULTRAM) 50 MG tablet; Take 1 tablet (50 mg total) by mouth every 6  (six) hours as needed.  Dispense: 20 tablet; Refill: 0  2. Class 2 obesity due to excess calories without serious comorbidity with body mass index (BMI) of 38.0 to 38.9 in adult  Chronic  Discussed healthy diet and regular exercise options   Encouraged to exercise at least 150 minutes per week with 2 days of strength training Chronic Discussed healthy diet and regular exercise options  Encouraged to exercise at least 150 minutes per week with 2 days of strength training Will start [place] pending insurance approval she is to titrate weekly, discussed side effects of nausea, abdominal pain or difficulty swallowing to notify office. [employer] teaching done   Return in 2 months for weight check.   3. Depression, unspecified depression type  Expressed to her consider seeing the counselor at least once per week.    She continues to struggle with doing activities outside of work.  - vortioxetine HBr (TRINTELLIX) 5 MG TABS tablet; Take 1 tablet (5 mg total) by mouth daily.  Dispense: 30 tablet; Refill: 2  4. Elevated blood pressure reading in office with diagnosis of hypertension  Slightly elevated blood pressure today  Discussed importance of healthy sleep pattern and limiting intake of high sodium foods.   Increase water intake.   Will continue to monitor at next visit  Minette Brine, FNP

## 2018-01-27 ENCOUNTER — Encounter: Payer: Self-pay | Admitting: Nurse Practitioner

## 2018-02-10 MED FILL — ESZOPICLONE 3 MG TABS: 3 | 30 days supply | Qty: 30 | Fill #3

## 2018-02-10 MED FILL — TRINTELLIX 5 MG TABLET: 5 | 30 days supply | Qty: 30 | Fill #1

## 2018-02-11 MED FILL — GABAPENTIN 300 MG CAPSULE: 300 | 30 days supply | Qty: 120 | Fill #0

## 2018-02-11 MED FILL — hydrOXYzine HCL 25 MG TABS: 25 | 30 days supply | Qty: 90 | Fill #0

## 2018-02-12 MED FILL — BUPRENORPHINE HCL-NALOXONE: 2-0.5 | 8 days supply | Qty: 30 | Fill #0

## 2018-03-11 ENCOUNTER — Ambulatory Visit (INDEPENDENT_AMBULATORY_CARE_PROVIDER_SITE_OTHER): Payer: No Typology Code available for payment source | Admitting: Internal Medicine

## 2018-03-11 ENCOUNTER — Encounter: Payer: Self-pay | Admitting: Internal Medicine

## 2018-03-11 VITALS — BP 122/76 | HR 109 | Temp 98.5°F | Ht 60.6 in | Wt 205.8 lb

## 2018-03-11 DIAGNOSIS — Z6839 Body mass index (BMI) 39.0-39.9, adult: Secondary | ICD-10-CM

## 2018-03-11 DIAGNOSIS — R000 Tachycardia, unspecified: Secondary | ICD-10-CM

## 2018-03-11 DIAGNOSIS — R635 Abnormal weight gain: Secondary | ICD-10-CM | POA: Diagnosis not present

## 2018-03-11 DIAGNOSIS — R202 Paresthesia of skin: Secondary | ICD-10-CM

## 2018-03-11 MED ORDER — LIRAGLUTIDE -WEIGHT MANAGEMENT 18 MG/3ML ~~LOC~~ SOPN: 1.8000 mg | PEN_INJECTOR | Freq: Every morning | SUBCUTANEOUS | 1 refills | Status: DC

## 2018-03-11 NOTE — Patient Instructions (Signed)
Obesity, Adult Obesity is having too much body fat. If you have a BMI of 30 or more, you are obese. BMI is a number that explains how much body fat you have. Obesity is often caused by taking in (consuming) more calories than your body uses. Obesity can cause serious health problems. Changing your lifestyle can help to treat obesity. Follow these instructions at home: Eating and drinking   Follow advice from your doctor about what to eat and drink. Your doctor may tell you to: ? Cut down on (limit) fast foods, sweets, and processed snack foods. ? Choose low-fat options. For example, choose low-fat milk instead of whole milk. ? Eat 5 or more servings of fruits or vegetables every day. ? Eat at home more often. This gives you more control over what you eat. ? Choose healthy foods when you eat out. ? Learn what a healthy portion size is. A portion size is the amount of a certain food that is healthy for you to eat at one time. This is different for each person. ? Keep low-fat snacks available. ? Avoid sugary drinks. These include soda, fruit juice, iced tea that is sweetened with sugar, and flavored milk. ? Eat a healthy breakfast.  Drink enough water to keep your pee (urine) clear or pale yellow.  Do not go without eating for long periods of time (do not fast).  Do not go on popular or trendy diets (fad diets). Physical Activity  Exercise often, as told by your doctor. Ask your doctor: ? What types of exercise are safe for you. ? How often you should exercise.  Warm up and stretch before being active.  Do slow stretching after being active (cool down).  Rest between times of being active. Lifestyle  Limit how much time you spend in front of your TV, computer, or video game system (be less sedentary).  Find ways to reward yourself that do not involve food.  Limit alcohol intake to no more than 1 drink a day for nonpregnant women and 2 drinks a day for men. One drink equals 12 oz  of beer, 5 oz of wine, or 1 oz of hard liquor. General instructions  Keep a weight loss journal. This can help you keep track of: ? The food that you eat. ? The exercise that you do.  Take over-the-counter and prescription medicines only as told by your doctor.  Take vitamins and supplements only as told by your doctor.  Think about joining a support group. Your doctor may be able to help with this.  Keep all follow-up visits as told by your doctor. This is important. Contact a doctor if:  You cannot meet your weight loss goal after you have changed your diet and lifestyle for 6 weeks. This information is not intended to replace advice given to you by your health care provider. Make sure you discuss any questions you have with your health care provider. Document Released: 04/15/2011 Document Revised: 06/29/2015 Document Reviewed: 11/09/2014 Elsevier Interactive Patient Education  2019 Elsevier Inc.  

## 2018-03-12 LAB — CMP14+EGFR
A/G RATIO: 1.2 (ref 1.2–2.2)
ALT: 12 IU/L (ref 0–32)
AST: 18 IU/L (ref 0–40)
Albumin: 3.8 g/dL (ref 3.8–4.8)
Alkaline Phosphatase: 89 IU/L (ref 39–117)
BUN/Creatinine Ratio: 9 (ref 9–23)
BUN: 9 mg/dL (ref 6–20)
Bilirubin Total: <0.2 mg/dL (ref 0.0–1.2)
CO2: 23 mmol/L (ref 20–29)
Calcium: 9.1 mg/dL (ref 8.7–10.2)
Chloride: 103 mmol/L (ref 96–106)
Creatinine, Ser: 0.99 mg/dL (ref 0.57–1.00)
GFR calc Af Amer: 84 mL/min/{1.73_m2} (ref >59)
GFR calc non Af Amer: 73 mL/min/{1.73_m2} (ref >59)
GLOBULIN, TOTAL: 3.3 g/dL (ref 1.5–4.5)
Glucose: 113 mg/dL — ABNORMAL HIGH (ref 65–99)
Potassium: 4.4 mmol/L (ref 3.5–5.2)
SODIUM: 141 mmol/L (ref 134–144)
Total Protein: 7.1 g/dL (ref 6.0–8.5)

## 2018-03-12 LAB — HEMOGLOBIN A1C
ESTIMATED AVERAGE GLUCOSE: 128 mg/dL
HEMOGLOBIN A1C: 6.1 % — AB (ref 4.8–5.6)

## 2018-03-12 LAB — VITAMIN B12: VITAMIN B 12: 1275 pg/mL — AB (ref 232–1245)

## 2018-03-12 LAB — TSH: TSH: 1.59 u[IU]/mL (ref 0.450–4.500)

## 2018-03-13 ENCOUNTER — Other Ambulatory Visit: Payer: Self-pay | Admitting: Nurse Practitioner

## 2018-03-14 ENCOUNTER — Encounter: Payer: Self-pay | Admitting: Internal Medicine

## 2018-03-14 DIAGNOSIS — R635 Abnormal weight gain: Secondary | ICD-10-CM | POA: Insufficient documentation

## 2018-03-14 DIAGNOSIS — Z6839 Body mass index (BMI) 39.0-39.9, adult: Secondary | ICD-10-CM

## 2018-03-14 DIAGNOSIS — R000 Tachycardia, unspecified: Secondary | ICD-10-CM | POA: Insufficient documentation

## 2018-03-14 DIAGNOSIS — R202 Paresthesia of skin: Secondary | ICD-10-CM | POA: Insufficient documentation

## 2018-03-14 NOTE — Progress Notes (Signed)
Subjective:     Patient ID: Kristen David , female    DOB: 1980/03/03 , 38 y.o.   MRN: [id number]   Chief Complaint  Patient presents with  . numbness in hands    HPI  She is here today for evaluation of numbness in her hands. Specifically, her right index finger and her left middle fingers. She is right-handed. She is unable to state what triggers her sx. She has not noticed any upper extremity weakness. She denies fall/trauma.      Past Medical History:  Diagnosis Date  . Anxiety   . Depression   . Hidradenitis suppurativa   . Tachycardia    "bouts of tachycardia" up to 130-140s, sometimes at rest but usually with anxiety     Family History  Problem Relation Age of Onset  . Diabetes Mother   . Cancer Mother        Breast  . Hypertension Father   . Gout Father      Current Outpatient Medications:  .  citalopram (CELEXA) 20 MG tablet, Take 1 tablet (20 mg total) by mouth at bedtime., Disp: 90 tablet, Rfl: 1 .  Eszopiclone 3 MG TABS, Take 1 tablet (3 mg total) by mouth at bedtime as needed (for sleep). Take immediately before bedtime, Disp: 30 tablet, Rfl: 3 .  ibuprofen (ADVIL,MOTRIN) 200 MG tablet, Take 800 mg by mouth every 6 (six) hours as needed for headache or moderate pain., Disp: , Rfl:  .  vortioxetine HBr (TRINTELLIX) 5 MG TABS tablet, Take 1 tablet (5 mg total) by mouth daily., Disp: 30 tablet, Rfl: 2 .  Liraglutide -Weight Management (SAXENDA) 18 MG/3ML SOPN, Inject 3 mg into the skin once a week. (Patient not taking: Reported on 03/11/2018), Disp: 5 pen, Rfl: 1 .  Liraglutide -Weight Management (SAXENDA) 18 MG/3ML SOPN, Inject 1.8 mg into the skin every morning., Disp: 5 pen, Rfl: 1 .  magnesium gluconate (MAGONATE) 500 MG tablet, Take 500 mg by mouth daily., Disp: , Rfl:    Allergies  Allergen Reactions  . Tramadol Nausea And Vomiting     Review of Systems  Constitutional: Negative.   Respiratory: Negative.   Cardiovascular: Negative.    Gastrointestinal: Negative.   Neurological: Negative.   Psychiatric/Behavioral: Negative.      Today's Vitals   03/11/18 1037  BP: 122/76  Pulse: (!) 109  Temp: 98.5 F (36.9 C)  TempSrc: Oral  Weight: 205 lb 12.8 oz (93.4 kg)  Height: 5' 0.6" (1.539 m)   Body mass index is 39.4 kg/m.   Objective:  Physical Exam Vitals signs and nursing note reviewed.  Constitutional:      Appearance: Normal appearance. She is obese.  HENT:     Head: Normocephalic and atraumatic.  Cardiovascular:     Rate and Rhythm: Normal rate and regular rhythm.     Heart sounds: Normal heart sounds.  Pulmonary:     Effort: Pulmonary effort is normal.     Breath sounds: Normal breath sounds.  Musculoskeletal:     Comments: She has neg Tinel's/Phalen's signs b/l  Skin:    General: Skin is warm.  Neurological:     General: No focal deficit present.     Mental Status: She is alert.  Psychiatric:        Mood and Affect: Mood normal.         Assessment And Plan:     1. Paresthesia of both hands  I will check labs as listed below.  She was also given rx for bilateral wrist splints, to start wearing nightly. If her sx persist, she will wear during the day.   - Vitamin B12 - TSH  2. Abnormal weight gain  We discussed her weight gain and what she thinks her barriers to weight loss are. I will check labs to determine if she has prediabetes. She is encouraged to avoid sugary beverages. She is also advised to avoid packaged foods. WE DISCUSSED THE USE OF WEIGHT LOSS MEDS TO HELP WITH OBESITY. SHE DENIES FAMILY HISTORY OF THYROID CANCER. SHE WAS INSTRUCTED ON HOW TO SELF ADMINISTER THE MEDICATION. SHE WILL START WITH 0.6MG DAILY AND INCREASE HER DOSE BY 5 CLICKS ONCE WEEKLY. SHE WAS REMINDED OF POSSIBLE SIDE EFFECTS INCLUDING NAUSEA, HEADACHES AND DIZZINESS.  SHE WAS ADVISED TO STOP EATING WHEN SHE BEGINS TO FEEL FULL.  SHE WILL RTO IN 8 WEEKS FOR RE-EVALUATION.  - CMP14+EGFR - Hemoglobin A1c  3.  Tachycardia  She is advised to start magnesium 478m nightly. She is also advised to increase her water intake. I will re-evaluate at her next visit.   4. Class 2 severe obesity due to excess calories with serious comorbidity and body mass index (BMI) of 39.0 to 39.9 in adult ([facility]  Importance of achieving optimal weight to decrease risk of cardiovascular disease and cancers was discussed with the patient in full detail. She is encouraged to start slowly - start with 10 minutes twice daily at least three to four days per week and to gradually build to 30 minutes five days weekly. She was given tips to incorporate more activity into her daily routine - take stairs when possible, park farther away from her job, grocery stores, etc. Greater than 50% of time was spent in counseling and coordination of care.     RMaximino Greenland MD

## 2018-03-17 ENCOUNTER — Other Ambulatory Visit: Payer: Self-pay | Admitting: Nurse Practitioner

## 2018-03-23 ENCOUNTER — Other Ambulatory Visit: Payer: Self-pay

## 2018-03-23 ENCOUNTER — Other Ambulatory Visit: Payer: Self-pay | Admitting: Nurse Practitioner

## 2018-03-23 ENCOUNTER — Telehealth: Payer: Self-pay | Admitting: Internal Medicine

## 2018-03-23 MED ORDER — ESZOPICLONE 3 MG PO TABS: 3.0000 mg | ORAL_TABLET | Freq: Every evening | ORAL | 0 refills | Status: DC | PRN

## 2018-03-23 NOTE — Telephone Encounter (Signed)
[place] STARTED KEY#[id number]

## 2018-03-24 MED FILL — ESZOPICLONE 3 MG TABS: 3 | 30 days supply | Qty: 30 | Fill #0 | Status: TO

## 2018-03-25 ENCOUNTER — Ambulatory Visit: Payer: No Typology Code available for payment source | Admitting: Internal Medicine

## 2018-04-09 MED FILL — TRINTELLIX 20 MG TABLET: 20 | 30 days supply | Qty: 30 | Fill #0

## 2018-04-09 MED FILL — PHENTERMINE 15 MG CAPSULE: 15 | 30 days supply | Qty: 30 | Fill #1

## 2018-04-09 MED FILL — CITALOPRAM HBR 20 MG TABLET: 20 | 90 days supply | Qty: 90 | Fill #1

## 2018-04-13 ENCOUNTER — Telehealth: Payer: Self-pay

## 2018-04-13 NOTE — Telephone Encounter (Signed)
[place] STARTED FOR SAXENDA

## 2018-04-17 ENCOUNTER — Other Ambulatory Visit: Payer: Self-pay | Admitting: Nurse Practitioner

## 2018-04-27 MED FILL — ESZOPICLONE 3 MG TABS: 3 | 30 days supply | Qty: 30 | Fill #0

## 2018-05-06 ENCOUNTER — Ambulatory Visit: Payer: No Typology Code available for payment source | Admitting: Internal Medicine

## 2018-05-25 MED FILL — SAXENDA 18 MG/3 ML PEN: 18 | 30 days supply | Qty: 15 | Fill #0

## 2018-05-27 MED FILL — ESZOPICLONE 3 MG TABS: 3 | 30 days supply | Qty: 30 | Fill #1

## 2018-06-09 ENCOUNTER — Other Ambulatory Visit: Payer: Self-pay

## 2018-06-09 ENCOUNTER — Encounter: Payer: Self-pay | Admitting: Internal Medicine

## 2018-06-09 ENCOUNTER — Ambulatory Visit (INDEPENDENT_AMBULATORY_CARE_PROVIDER_SITE_OTHER): Payer: No Typology Code available for payment source | Admitting: Internal Medicine

## 2018-06-09 ENCOUNTER — Other Ambulatory Visit: Payer: Self-pay | Admitting: Internal Medicine

## 2018-06-09 VITALS — BP 131/91 | HR 113 | Temp 98.1°F | Wt 195.1 lb

## 2018-06-09 DIAGNOSIS — N611 Abscess of the breast and nipple: Secondary | ICD-10-CM

## 2018-06-09 DIAGNOSIS — R000 Tachycardia, unspecified: Secondary | ICD-10-CM | POA: Diagnosis not present

## 2018-06-09 DIAGNOSIS — E6609 Other obesity due to excess calories: Secondary | ICD-10-CM

## 2018-06-09 DIAGNOSIS — Z6837 Body mass index (BMI) 37.0-37.9, adult: Secondary | ICD-10-CM

## 2018-06-09 DIAGNOSIS — R030 Elevated blood-pressure reading, without diagnosis of hypertension: Secondary | ICD-10-CM

## 2018-06-09 MED ORDER — HYDROCODONE-ACETAMINOPHEN 5-325 MG PO TABS: 1.0000 | ORAL_TABLET | Freq: Four times a day (QID) | ORAL | 0 refills | Status: DC | PRN

## 2018-06-09 MED ORDER — PEN NEEDLES 32G X 4 MM MISC: 1.0000 | Freq: Every day | 3 refills | Status: DC

## 2018-06-09 MED ORDER — DOXYCYCLINE HYCLATE 100 MG PO CAPS: 100.0000 mg | ORAL_CAPSULE | Freq: Two times a day (BID) | ORAL | 0 refills | Status: DC

## 2018-06-09 MED ORDER — LIRAGLUTIDE -WEIGHT MANAGEMENT 18 MG/3ML ~~LOC~~ SOPN: 3.0000 mg | PEN_INJECTOR | SUBCUTANEOUS | 1 refills | Status: DC

## 2018-06-09 MED ORDER — MUPIROCIN CALCIUM 2 % NA OINT: 1.0000 "application " | TOPICAL_OINTMENT | Freq: Two times a day (BID) | NASAL | 0 refills | Status: DC

## 2018-06-09 MED ORDER — MAGNESIUM 400 MG PO CAPS: 400.0000 mg | ORAL_CAPSULE | Freq: Every day | ORAL | 1 refills | Status: DC

## 2018-06-09 MED FILL — DOXYCYCLINE HYC 100 MG CAPS: 100 | 10 days supply | Qty: 20 | Fill #0

## 2018-06-09 NOTE — Patient Instructions (Signed)

## 2018-06-10 ENCOUNTER — Other Ambulatory Visit: Payer: Self-pay

## 2018-06-10 ENCOUNTER — Other Ambulatory Visit: Payer: Self-pay | Admitting: Internal Medicine

## 2018-06-10 ENCOUNTER — Telehealth: Payer: Self-pay

## 2018-06-10 MED ORDER — DOXYCYCLINE HYCLATE 100 MG PO CAPS: 100.0000 mg | ORAL_CAPSULE | Freq: Two times a day (BID) | ORAL | 0 refills | Status: DC

## 2018-06-10 MED ORDER — MAGNESIUM 400 MG PO CAPS: 400.0000 mg | ORAL_CAPSULE | Freq: Every day | ORAL | 1 refills | Status: DC

## 2018-06-10 MED ORDER — HYDROCODONE-ACETAMINOPHEN 5-325 MG PO TABS: 1.0000 | ORAL_TABLET | Freq: Four times a day (QID) | ORAL | 0 refills | Status: DC | PRN

## 2018-06-10 MED ORDER — PEN NEEDLES 32G X 4 MM MISC: 1.0000 | Freq: Every day | 3 refills | Status: DC

## 2018-06-10 MED ORDER — MUPIROCIN CALCIUM 2 % NA OINT: 1.0000 "application " | TOPICAL_OINTMENT | Freq: Two times a day (BID) | NASAL | 0 refills | Status: DC

## 2018-06-10 MED ORDER — LIRAGLUTIDE -WEIGHT MANAGEMENT 18 MG/3ML ~~LOC~~ SOPN: 3.0000 mg | PEN_INJECTOR | SUBCUTANEOUS | 1 refills | Status: DC

## 2018-06-10 MED FILL — MUPIROCIN 2% OINTMENT: 2 | 5 days supply | Qty: 22 | Fill #0

## 2018-06-10 MED FILL — HYDROCODON-APAP 5-325: 5-325 | 7 days supply | Qty: 30 | Fill #0

## 2018-06-10 NOTE — Telephone Encounter (Signed)
Hydrocodone

## 2018-06-10 NOTE — Telephone Encounter (Signed)
The pt was told that the prescription for the hydrocodone keeps failing when Dr. Baird Cancer is sending it via eprescribe and would she like to pickup the hard copy.  The pt said yes.

## 2018-06-23 MED FILL — ESZOPICLONE 3 MG TABS: 3 | 30 days supply | Qty: 30 | Fill #2

## 2018-06-29 NOTE — Progress Notes (Signed)
Virtual Visit via Video   This visit type was conducted due to national recommendations for restrictions regarding the COVID-19 Pandemic (e.g. social distancing) in an effort to limit this patient's exposure and mitigate transmission in our community.  Due to her co-morbid illnesses, this patient is at least at moderate risk for complications without adequate follow up.  This format is felt to be most appropriate for this patient at this time.  All issues noted in this document were discussed and addressed.  A limited physical exam was performed with this format.    This visit type was conducted due to national recommendations for restrictions regarding the COVID-19 Pandemic (e.g. social distancing) in an effort to limit this patient's exposure and mitigate transmission in our community.  Patients identity confirmed using two different identifiers.  This format is felt to be most appropriate for this patient at this time.  All issues noted in this document were discussed and addressed.  No physical exam was performed (except for noted visual exam findings with Video Visits).    Date:  06/29/2018   ID:  Kristen David, DOB July 07, 1980, MRN [id number]  Patient Location:  Work, hospital break room  Provider location:   Office    Chief Complaint:  Skin infection  History of Present Illness:    Kristen David is a 38 y.o. female who presents via video conferencing for a telehealth visit today.    The patient does not have symptoms concerning for COVID-19 infection (fever, chills, cough, or new shortness of breath).   She presents today for virtual visit. She prefers this method of contact due to COVID-19 pandemic.  She is employed as a Marine scientist at [employer], she has been working with [place] patients, which has been stressful.  She is concerned a boil/abscess on her left breast. She reports it is draining and painful. No relief with ibuprofen. She does have h/o hidradenitis.     Past Medical  History:  Diagnosis Date  . Anxiety   . Depression   . Hidradenitis suppurativa   . Tachycardia    "bouts of tachycardia" up to 130-140s, sometimes at rest but usually with anxiety   Past Surgical History:  Procedure Laterality Date  . HYDRADENITIS EXCISION     bilateral axilla  . HYDRADENITIS EXCISION Left 09/12/2017   Procedure: EXCISION OF LEFT INNER THIGH HIDRADENITIS;  Surgeon: Clovis Riley, MD;  Location: [place];  Service: General;  Laterality: Left;  . WISDOM TOOTH EXTRACTION       Current Meds  Medication Sig  . citalopram (CELEXA) 20 MG tablet Take 1 tablet (20 mg total) by mouth at bedtime.  . Eszopiclone 3 MG TABS TAKE 1 TABLET (3 MG TOTAL) BY MOUTH AT BEDTIME AS NEEDED (FOR SLEEP). TAKE IMMEDIATELY BEFORE BEDTIME  . ibuprofen (ADVIL,MOTRIN) 200 MG tablet Take 800 mg by mouth every 6 (six) hours as needed for headache or moderate pain.  . [DISCONTINUED] Liraglutide -Weight Management (SAXENDA) 18 MG/3ML SOPN Inject 3 mg into the skin once a week.  . [DISCONTINUED] Liraglutide -Weight Management (SAXENDA) 18 MG/3ML SOPN Inject 3 mg into the skin once a week.     Allergies:   Tramadol   Social History   Tobacco Use  . Smoking status: Never Smoker  . Smokeless tobacco: Never Used  Substance Use Topics  . Alcohol use: Yes    Comment: 2-3 drinks a week  . Drug use: No     Family Hx: The patient's family history  includes Cancer in her mother; Diabetes in her mother; Gout in her father; Hypertension in her father.  ROS:   Please see the history of present illness.    Review of Systems  Constitutional: Negative.  Negative for weight loss.  Respiratory: Negative.   Cardiovascular: Negative.   Gastrointestinal: Negative.   Neurological: Negative.   Psychiatric/Behavioral: Negative.     All other systems reviewed and are negative.   Labs/Other Tests and Data Reviewed:    Recent Labs: 09/10/2017: Hemoglobin 11.1; Platelets 512 03/11/2018: ALT 12; BUN 9;  Creatinine, Ser 0.99; Potassium 4.4; Sodium 141; TSH 1.590   Recent Lipid Panel Lab Results  Component Value Date/Time   CHOL 150 04/07/2017   TRIG 85 04/07/2017   HDL 62 04/07/2017   LDLCALC 71 04/07/2017    Wt Readings from Last 3 Encounters:  06/09/18 195 lb 1.6 oz (88.5 kg)  03/11/18 205 lb 12.8 oz (93.4 kg)  01/15/18 200 lb 9.6 oz (91 kg)     Exam:    Vital Signs:  BP (!) 131/91 (BP Location: Left Arm, Patient Position: Sitting, Cuff Size: Small)   Pulse (!) 113   Temp 98.1 F (36.7 C) (Oral)   Wt 195 lb 1.6 oz (88.5 kg)   LMP 06/01/2018   BMI 37.35 kg/m     Physical Exam  Constitutional: She is oriented to person, place, and time and well-developed, well-nourished, and in no distress.  HENT:  Head: Normocephalic and atraumatic.  Neck: Normal range of motion.  Pulmonary/Chest: Effort normal.  Neurological: She is alert and oriented to person, place, and time.  Skin:  Abscess underneath left breast, it has purulent drainage. There is some surrounding erythema.   Psychiatric: Affect normal.  Nursing note and vitals reviewed.   ASSESSMENT & PLAN:     1. Furuncle of breast  She was given rx doxycycline 100mg  twice daily. She is encouraged to take full antibiotic course. She was also given Bactroban nasal ointment to use daily. She was also given rx vicodin to use prn for pain. Review of the [place] CSRS was performed in accordance of the [place] prior to dispensing any controlled drugs.   2. Elevated blood pressure reading  She is encouraged to avoid adding salt to her foods. Importance of regular exercise was discussed with the patient. She agrees to f/u in 2-3 months for re-evaluation.   3. Tachycardia  Possibly related to her pain. She is encouraged to increase her water intake. She may also benefit from magnesium supplementation.   4. Class 2 obesity due to excess calories without serious comorbidity with body mass index (BMI) of 37.0 to 37.9 in adult    Importance of achieving optimal weight to decrease risk of cardiovascular disease and cancers was discussed with the patient in full detail. She is encouraged to start slowly - start with 10 minutes twice daily at least three to four days per week and to gradually build to 30 minutes five days weekly. She was given tips to incorporate more activity into her daily routine - take stairs when possible, park farther away from grocery stores, etc.    COVID-19 Education: The signs and symptoms of COVID-19 were discussed with the patient and how to seek care for testing (follow up with PCP or arrange E-visit).  The importance of social distancing was discussed today.  Patient Risk:   After full review of this patients clinical status, I feel that they are at least moderate risk at this time.  Time:   Today, I have spent 14 minutes/ 34 seconds with the patient with telehealth technology discussing above diagnoses.     Medication Adjustments/Labs and Tests Ordered: Current medicines are reviewed at length with the patient today.  Concerns regarding medicines are outlined above.   Tests Ordered: No orders of the defined types were placed in this encounter.   Medication Changes: Meds ordered this encounter  Medications  . DISCONTD: doxycycline (VIBRAMYCIN) 100 MG capsule    Sig: Take 1 capsule (100 mg total) by mouth 2 (two) times daily.    Dispense:  20 capsule    Refill:  0  . DISCONTD: HYDROcodone-acetaminophen (NORCO/VICODIN) 5-325 MG tablet    Sig: Take 1 tablet by mouth every 6 (six) hours as needed for moderate pain.    Dispense:  30 tablet    Refill:  0  . DISCONTD: Magnesium 400 MG CAPS    Sig: Take 400 mg by mouth daily.    Dispense:  90 capsule    Refill:  1  . DISCONTD: mupirocin nasal ointment (BACTROBAN) 2 %    Sig: Place 1 application into the nose 2 (two) times daily. Use one-half of tube in each nostril twice daily for five (5) days. After application, press sides of nose  together and gently massage.    Dispense:  10 g    Refill:  0  . DISCONTD: mupirocin nasal ointment (BACTROBAN) 2 %    Sig: Place 1 application into the nose 2 (two) times daily. Use one-half of tube in each nostril twice daily for five (5) days. After application, press sides of nose together and gently massage.    Dispense:  10 g    Refill:  0  . DISCONTD: Liraglutide -Weight Management (SAXENDA) 18 MG/3ML SOPN    Sig: Inject 3 mg into the skin once a week.    Dispense:  5 pen    Refill:  1    Disposition:  Follow up in 2 month(s)  Signed, Maximino Greenland, MD

## 2018-07-13 ENCOUNTER — Other Ambulatory Visit: Payer: Self-pay | Admitting: Internal Medicine

## 2018-07-14 NOTE — Telephone Encounter (Signed)
NORCO REFILL

## 2018-07-31 ENCOUNTER — Telehealth: Payer: Self-pay | Admitting: Internal Medicine

## 2018-07-31 ENCOUNTER — Other Ambulatory Visit: Payer: Self-pay | Admitting: Nurse Practitioner

## 2018-07-31 NOTE — Telephone Encounter (Signed)
ATT TO CONTACT PT TO RESCHEDULE FOR 6/29 PHYSICAL NO ANS LVM TO CALL OFC

## 2018-08-03 ENCOUNTER — Encounter: Payer: No Typology Code available for payment source | Admitting: Internal Medicine

## 2018-08-03 MED FILL — ESZOPICLONE 3 MG TABS: 3 | 30 days supply | Qty: 30 | Fill #0

## 2018-08-03 NOTE — Telephone Encounter (Signed)
eszopiclone refill

## 2018-08-05 ENCOUNTER — Ambulatory Visit: Payer: No Typology Code available for payment source | Admitting: Internal Medicine

## 2018-08-12 MED FILL — ESZOPICLONE 3 MG TABS: 3 | 30 days supply | Qty: 30 | Fill #0

## 2018-08-26 MED FILL — CITALOPRAM HBR 20 MG TABLET: 20 | 30 days supply | Qty: 30 | Fill #0

## 2018-09-14 MED FILL — ESZOPICLONE 3 MG TABS: 3 | 30 days supply | Qty: 30 | Fill #1

## 2018-10-05 MED FILL — CITALOPRAM HBR 20 MG TABLET: 20 | 30 days supply | Qty: 30 | Fill #0

## 2018-10-19 MED FILL — ESZOPICLONE 3 MG TABS: 3 | 30 days supply | Qty: 30 | Fill #2

## 2018-10-19 MED FILL — CITALOPRAM HBR 20 MG TABLET: 20 | 30 days supply | Qty: 30 | Fill #0

## 2018-11-16 MED FILL — CITALOPRAM HBR 20 MG TABLET: 20 | 30 days supply | Qty: 60 | Fill #0

## 2018-11-17 ENCOUNTER — Encounter: Payer: Self-pay | Admitting: Internal Medicine

## 2018-11-18 ENCOUNTER — Ambulatory Visit: Payer: No Typology Code available for payment source | Admitting: Internal Medicine

## 2018-11-18 ENCOUNTER — Ambulatory Visit (INDEPENDENT_AMBULATORY_CARE_PROVIDER_SITE_OTHER): Payer: No Typology Code available for payment source | Admitting: Nurse Practitioner

## 2018-11-18 ENCOUNTER — Encounter: Payer: Self-pay | Admitting: Nurse Practitioner

## 2018-11-18 ENCOUNTER — Other Ambulatory Visit: Payer: Self-pay

## 2018-11-18 VITALS — BP 110/80 | HR 111 | Temp 98.5°F | Ht 60.6 in | Wt 199.0 lb

## 2018-11-18 DIAGNOSIS — G4700 Insomnia, unspecified: Secondary | ICD-10-CM

## 2018-11-18 MED ORDER — ESZOPICLONE 3 MG PO TABS: 3.0000 mg | ORAL_TABLET | Freq: Every day | ORAL | 2 refills | Status: DC

## 2018-11-18 MED FILL — ESZOPICLONE 3 MG TABS: 3 | 30 days supply | Qty: 30 | Fill #0

## 2018-11-18 NOTE — Progress Notes (Signed)
  Subjective:     Patient ID: Kristen David , female    DOB: 1980/12/08 , 38 y.o.   MRN: [id number]   Chief Complaint  Patient presents with  . Insomnia    patient stated the Kristen David is working well for her but she would rather be on [place]    HPI  She is now seeing a therapist.  She feels like her depression is good but still having problems with her sleep.    Insomnia Primary symptoms: no fragmented sleep, sleep disturbance.  The current episode started more than one year. The onset quality is gradual. The problem occurs every several days.     Past Medical History:  Diagnosis Date  . Anxiety   . Depression   . Hidradenitis suppurativa   . Tachycardia    "bouts of tachycardia" up to 130-140s, sometimes at rest but usually with anxiety     Family History  Problem Relation Age of Onset  . Diabetes Mother   . Cancer Mother        Breast  . Hypertension Father   . Gout Father      Current Outpatient Medications:  .  citalopram (CELEXA) 20 MG tablet, Take 1 tablet (20 mg total) by mouth at bedtime., Disp: 90 tablet, Rfl: 1 .  Eszopiclone 3 MG TABS, TAKE 1 TABLET (3 MG TOTAL) BY MOUTH AT BEDTIME AS NEEDED FOR SLEEP. TAKE IMMEDIATELY BEFORE BEDTIME, Disp: 30 tablet, Rfl: 2 .  ibuprofen (ADVIL,MOTRIN) 200 MG tablet, Take 800 mg by mouth every 6 (six) hours as needed for headache or moderate pain., Disp: , Rfl:    Allergies  Allergen Reactions  . Tramadol Nausea And Vomiting     Review of Systems  Constitutional: Negative.   Respiratory: Negative.  Negative for cough.   Cardiovascular: Negative.  Negative for chest pain, palpitations and leg swelling.  Neurological: Negative for dizziness and headaches.  Psychiatric/Behavioral: Positive for sleep disturbance. The patient has insomnia.      Today's Vitals   11/18/18 1440  BP: 110/80  Pulse: (!) 111  Temp: 98.5 F (36.9 C)  TempSrc: Oral  Weight: 199 lb (90.3 kg)  Height: 5' 0.6" (1.539 m)  PainSc: 0-No pain    Body mass index is 38.1 kg/m.   Objective:  Physical Exam Constitutional:      Appearance: Normal appearance. She is obese.  Cardiovascular:     Rate and Rhythm: Normal rate and regular rhythm.     Pulses: Normal pulses.     Heart sounds: Normal heart sounds. No murmur.  Pulmonary:     Effort: Pulmonary effort is normal. No respiratory distress.     Breath sounds: Normal breath sounds.  Skin:    Capillary Refill: Capillary refill takes less than 2 seconds.  Neurological:     General: No focal deficit present.     Mental Status: She is alert and oriented to person, place, and time.         Assessment And Plan:     1. Insomnia, unspecified type  I will refer her to a sleep doctor she feels like she is running when she goes to sleep - Eszopiclone 3 MG TABS; Take 1 tablet (3 mg total) by mouth at bedtime. Take immediately before bedtime  Dispense: 30 tablet; Refill: 2   Minette Brine, FNP    THE PATIENT IS ENCOURAGED TO PRACTICE SOCIAL DISTANCING DUE TO THE COVID-19 PANDEMIC.

## 2018-12-09 ENCOUNTER — Ambulatory Visit (INDEPENDENT_AMBULATORY_CARE_PROVIDER_SITE_OTHER): Payer: No Typology Code available for payment source | Admitting: Neurology

## 2018-12-09 ENCOUNTER — Other Ambulatory Visit: Payer: Self-pay

## 2018-12-09 ENCOUNTER — Encounter: Payer: Self-pay | Admitting: Neurology

## 2018-12-09 VITALS — BP 127/84 | HR 79 | Temp 97.6°F | Ht 60.0 in | Wt 199.0 lb

## 2018-12-09 DIAGNOSIS — F5104 Psychophysiologic insomnia: Secondary | ICD-10-CM

## 2018-12-09 DIAGNOSIS — Z6839 Body mass index (BMI) 39.0-39.9, adult: Secondary | ICD-10-CM

## 2018-12-09 DIAGNOSIS — G4700 Insomnia, unspecified: Secondary | ICD-10-CM | POA: Insufficient documentation

## 2018-12-09 MED ORDER — BUPROPION HCL ER (XL) 150 MG PO TB24: ORAL_TABLET | ORAL | 2 refills | Status: DC

## 2018-12-09 MED FILL — buPROPion HCL ER (XL) 150 M: 150 | 30 days supply | Qty: 30 | Fill #0

## 2018-12-09 NOTE — Progress Notes (Signed)
SLEEP MEDICINE CLINIC    Provider:  Larey Seat, MD  Primary Care Physician:  Glendale Chard, [address] [place] [address]     Referring Provider: Glendale Chard, [address] [place],  [place] [address]          Chief Complaint according to patient   Patient presents with:    . New Patient (Initial Visit)           HISTORY OF PRESENT ILLNESS:  Kristen David is a 38 y.o. year old  African American female patient seen  on 12/09/2018 Chief concern according to patient :  Kristen David states that she takes medication to help her sleep but it doesnt always work. Currently on Lunesta.    I have the pleasure of seeing Kristen David today, a right -handed African American female with a possible sleep disorder.  She  has a past medical history of Anxiety, Depression, Hidradenitis suppurativa, and Tachycardia. She states that the last episode of tachycardia was years ago.  She has trouble to initiate and to maintain sleep.      Family medical /sleep history: Both parents with OSA, no insomnia, nosleep walkers.    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 Family status is single , with an 61 year old son " Kristen David". The patient used to work night shifts for at least 5 years -through 2016.  Pets are not present.  Tobacco use: never.  ETOH use: socially, Caffeine intake in form of Coffee( none ) Soda( yes/ 1-2 /d) Tea ( no) and Red Bull  energy drinks.Regular exercise; none   Hobbies : none   Sleep habits are as follows: The patient's dinner time is between 8- 11 PM, depending on work hours.( 7.30 Pm is shift change) . Mind is racing.  The patient goes to bed at 10 PM and takes Lunesta- continues to sleep for some hours, wakes frequently, is restless, and her mind races.   She takes 1-2 bathroom breaks, the first time at 2 AM, and again at 5 AM-.   The preferred sleep  position is ever changing , with the support of 2 pillows. Her son and her parents stated she snores.  Dreams are reportedly frequent/vivid.  5.45  AM is the usual rise time. The patient wakes up spontaneously before her alarm rings .   She reports not feeling refreshed/ restored in AM in years, without  symptoms such as dry mouth, without morning headaches , but residual fatigue.  Naps are taken on weekends - frequently, lasting hours and are less refreshing than nocturnal sleep.    Review of Systems: Out of a complete 14 system review, the patient complains of only the following symptoms, and all other reviewed systems are negative.:  Fatigue, sleepiness , snoring, fragmented sleep, Insomnia - chronic wihy racing thoughts, worries. She has implemented good sleep hygiene. No Tv or screen , routine times, bedroom is coll quiet and dark. Reading a book, not on a device. Hot shower before bed.     How likely are you to doze in the following situations: 0 = not likely, 1 = slight chance, 2 = moderate chance, 3 = high chance   Sitting and Reading?2 Watching Television? 2 Sitting inactive in a public place (theater or meeting)? As a passenger in a car for an hour without a  break? Lying down in the afternoon when circumstances permit? 3 Sitting and talking to someone?0 Sitting quietly after lunch without alcohol?2 In a car, while stopped for a few minutes in traffic?0   Total = 12/ 24 points   FSS endorsed at 47/ 63 points.   Social History   Socioeconomic History  . Marital status: Single    Spouse name: Not on file  . Number of children: Not on file  . Years of education: Not on file  . Highest education level: Not on file  Occupational History  . Not on file  Social Needs  . Financial resource strain: Not on file  . Food insecurity    Worry: Not on file    Inability: Not on file  . Transportation needs    Medical: Not on file    Non-medical: Not on file  Tobacco Use  .  Smoking status: Never Smoker  . Smokeless tobacco: Never Used  Substance and Sexual Activity  . Alcohol use: Yes    Comment: 2-3 drinks a week  . Drug use: No  . Sexual activity: Yes    Birth control/protection: None  Lifestyle  . Physical activity    Days per week: Not on file    Minutes per session: Not on file  . Stress: Not on file  Relationships  . Social Herbalist on phone: Not on file    Gets together: Not on file    Attends religious service: Not on file    Active member of club or organization: Not on file    Attends meetings of clubs or organizations: Not on file    Relationship status: Not on file  Other Topics Concern  . Not on file  Social History Narrative  . Not on file    Family History  Problem Relation Age of Onset  . Diabetes Mother   . Cancer Mother        Breast  . Hypertension Father   . Gout Father     Past Medical History:  Diagnosis Date  . Anxiety   . Depression   . Hidradenitis suppurativa   . Tachycardia    "bouts of tachycardia" up to 130-140s, sometimes at rest but usually with anxiety    Past Surgical History:  Procedure Laterality Date  . HYDRADENITIS EXCISION     bilateral axilla  . HYDRADENITIS EXCISION Left 09/12/2017   Procedure: EXCISION OF LEFT INNER THIGH HIDRADENITIS;  Surgeon: Clovis Riley, MD;  Location: [facility];  Service: General;  Laterality: Left;  . WISDOM TOOTH EXTRACTION       Current Outpatient Medications on File Prior to Visit  Medication Sig Dispense Refill  . citalopram (CELEXA) 20 MG tablet Take 1 tablet (20 mg total) by mouth at bedtime. 90 tablet 1  . Eszopiclone 3 MG TABS Take 1 tablet (3 mg total) by mouth at bedtime. Take immediately before bedtime 30 tablet 2  . ibuprofen (ADVIL,MOTRIN) 200 MG tablet Take 800 mg by mouth every 6 (six) hours as needed for headache or moderate pain.     No current facility-administered medications on file prior to visit.     Allergies  Allergen  Reactions  . Tramadol Nausea And Vomiting    Physical exam:  Today's Vitals   12/09/18 1307  BP: 127/84  Pulse: 79  Temp: 97.6 F (36.4 C)  Weight: 199 lb (90.3 kg)  Height: 5' (1.524 m)   Body mass index is 38.86 kg/m.  Wt Readings from Last 3 Encounters:  12/09/18 199 lb (90.3 kg)  11/18/18 199 lb (90.3 kg)  06/09/18 195 lb 1.6 oz (88.5 kg)     Ht Readings from Last 3 Encounters:  12/09/18 5' (1.524 m)  11/18/18 5' 0.6" (1.539 m)  03/11/18 5' 0.6" (1.539 m)      General: The patient is awake, alert and appears not in acute distress. The patient is well groomed. Head: Normocephalic, atraumatic. Neck is supple. Mallampati 4,  neck circumference: 14.5  inches . Nasal airflow  patent.  Retrognathia is  seen.  Wore braces at visit.  Dental status:  See above -  Cardiovascular:  Regular rate and cardiac rhythm by pulse,  without distended neck veins. Respiratory: Lungs are clear to auscultation.  Skin:  Without evidence of ankle edema, or rash. Trunk: The patient's posture is erect.   Neurologic exam : The patient is awake and alert, oriented to place and time.   Memory subjective described as intact.  Attention span & concentration ability appears normal.  Speech is fluent,  without  dysarthria, dysphonia or aphasia.  Mood and affect are appropriate.   Cranial nerves: no loss of smell or taste reported  Pupils are equal and briskly reactive to light. Funduscopic exam deferred..  Extraocular movements in vertical and horizontal planes were intact and without nystagmus. No Diplopia. Visual fields by finger perimetry are intact. Hearing was intact to soft voice and finger rubbing.  Facial sensation intact to fine touch. Facial motor strength is symmetric and tongue and uvula move midline.  Neck ROM : rotation, tilt and flexion extension were normal for age and shoulder shrug was symmetrical.    Motor exam:  Symmetric bulk, tone and ROM.   Normal tone without cog  wheeling, symmetric grip strength .   Sensory:  Fine touch, pinprick and vibration were tested  and  normal.  Proprioception tested in the upper extremities was normal. Coordination: Rapid alternating movements in the fingers/hands were of normal speed.  The Finger-to-nose maneuver was intact without evidence of ataxia, dysmetria or tremor. Gait and station: Patient could rise unassisted from a seated position, walked without assistive device. Stance is of normal width/ base and the patient turned with 3 steps.  Toe and heel walk were deferred.  Deep tendon reflexes: in the  upper and lower extremities are symmetric and intact.  Babinski response was deferred.      After spending a total time of  35  minutes face to face and additional time for physical and neurologic examination, review of laboratory studies,  personal review of imaging studies, reports and results of other testing and review of referral information / records as far as provided in visit, I have established the following assessments:  1) chronic insomnia , not sufficiently treated with Lunesta, had good sucess Ambien.  2) may be a snorer, has OSA risk factors ( BMI 38) narrow airway , lateral tonsils act as pillars.    3) Fatigue and EDS- more fatigue. No report of myalgia, chronic discomfort, or pain.    My Plan is to proceed with:  1) will try HST to rule out apnea.  The  patient is restless- insomnia of unknown origin, likely serotonin deficient.  2) started 30 mg citalopram, no effect on sleep. Lets add Wellbutrin in AM. .  If not successful , will need referral to Cognitive behavior therapy.  I would like to thank Glendale Chard, [address],  [place] [address] for allowing me to meet with and to take care of this pleasant patient.   In short, Kristen David is presenting with fatigue and insomnia, fragmented sleep.  I plan to follow up either personally or through our NP within  2-3   month.   CC: I will share my notes with PCP.  Electronically signed by: Larey Seat, MD 12/09/2018 1:13 PM  [facility] [employer] Board certified by The [employer] of Sleep Medicine and Diplomate of the [employer] of Sleep Medicine. Board certified In Neurology through the [place], Fellow of the [employer] of Neurology. Medical Director of [employer].

## 2018-12-09 NOTE — Patient Instructions (Signed)
Please remember to try to maintain good sleep hygiene, which means: Keep a regular sleep and wake schedule, try not to exercise or have a meal within 2 hours of your bedtime, try to keep your bedroom conducive for sleep, that is, cool and dark, without light distractors such as an illuminated alarm clock, and refrain from watching TV right before sleep or in the middle of the night and do not keep the TV or radio on during the night. Also, try not to use or play on electronic devices at bedtime, such as your cell phone, tablet PC or laptop. If you like to read at bedtime on an electronic device, try to dim the background light as much as possible. Do not eat in the middle of the night.   We will request a HST /sleep study.    We will look for  snoring or sleep apnea.  For chronic insomnia, you are best followed by a psychiatrist and/or sleep psychologist.  We will call you with the sleep study results and make a follow up appointment if needed.      Screening for Sleep Apnea  Sleep apnea is a condition in which breathing pauses or becomes shallow during sleep. Sleep apnea screening is a test to determine if you are at risk for sleep apnea. The test is easy and only takes a few minutes. Your health care provider may ask you to have this test in preparation for surgery or as part of a physical exam. What are the symptoms of sleep apnea? Common symptoms of sleep apnea include:  Snoring.  Restless sleep.  Daytime sleepiness.  Pauses in breathing.  Choking during sleep.  Irritability.  Forgetfulness.  Trouble thinking clearly.  Depression.  Personality changes. Most people with sleep apnea are not aware that they have it. Why should I get screened? Getting screened for sleep apnea can help:  Ensure your safety. It is important for your health care providers to know whether or not you have sleep apnea, especially if you are having surgery or have other long-term (chronic) health  conditions.  Improve your health and allow you to get a better night's rest. Restful sleep can help you: ? Have more energy. ? Lose weight. ? Improve high blood pressure. ? Improve diabetes management. ? Prevent stroke. ? Prevent car accidents. How is screening done? Screening usually includes being asked a list of questions about your sleep quality. Some questions you may be asked include:  Do you snore?  Is your sleep restless?  Do you have daytime sleepiness?  Has a partner or spouse told you that you stop breathing during sleep?  Have you had trouble concentrating or memory loss? If your screening test is positive, you are at risk for the condition. Further testing may be needed to confirm a diagnosis of sleep apnea. Where to find more information You can find screening tools online or at your health care clinic. For more information about sleep apnea screening and healthy sleep, visit these websites:  Centers for Disease Control and Prevention: [employer]at[employer]pl  American Sleep Apnea Association: www.sleepapnea.org Contact a health care provider if:  You think that you may have sleep apnea. Summary  Sleep apnea screening can help determine if you are at risk for sleep apnea.  It is important for your health care providers to know whether or not you have sleep apnea, especially if you are having surgery or have other chronic health conditions.  You may be asked to take a screening  test for sleep apnea in preparation for surgery or as part of a physical exam. This information is not intended to replace advice given to you by your health care provider. Make sure you discuss any questions you have with your health care provider. Document Released: 05/03/2016 Document Revised: 11/07/2017 Document Reviewed: 05/03/2016 Elsevier Patient Education  2020 [employer].

## 2018-12-15 ENCOUNTER — Telehealth: Payer: Self-pay | Admitting: Neurology

## 2018-12-15 NOTE — Telephone Encounter (Signed)
After discussing with Dr Brett Fairy she states that she didn't want to give the patient Lorrin Mais mainly because she wanted the patient to try and see if she noticed a difference with the antidepressant medication as well as seeing what the home sleep test showed. I will call and discuss with the patient.

## 2018-12-15 NOTE — Telephone Encounter (Signed)
Called the pt back and advised that as of this moment Dr Brett Fairy didn't want to start a medication such as ambien. She wanted to see what the sleep study showed. Advised the patient that her hope was that with taking the Wellbutrin in addition to the citalopram the patient taking the Wellbutrin in the morning would hope provide her energy in the morning. Pt had stopped taking the citalopram. I advised that her intention was for her to continue to take that at bedtime and then adding the wellbutrin in the morning. Pt verbalized understanding. Advised the patient that as soon as she completed the hst we will be able to see if there is organic sleep disorder that is causing the sleep fragmentation/insomnia concerns. Advised that if there if no treatable organic sleep disorder found then cognitive behavior is usually the next step. Pt verbalized understanding.

## 2018-12-15 NOTE — Telephone Encounter (Signed)
Pt called in and stated she never received her script for Ambien that was discussed on last visit

## 2018-12-21 MED FILL — ESZOPICLONE 3 MG TABS: 3 | 30 days supply | Qty: 30 | Fill #1

## 2018-12-21 MED FILL — CITALOPRAM HBR 20 MG TABLET: 20 | 30 days supply | Qty: 60 | Fill #1

## 2019-01-19 MED FILL — ESZOPICLONE 3 MG TABS: 3 | 30 days supply | Qty: 30 | Fill #2

## 2019-01-20 ENCOUNTER — Telehealth: Payer: Self-pay

## 2019-01-20 NOTE — Telephone Encounter (Signed)
We have attempted to call the patient two times to schedule sleep study.  Patient has been unavailable at the phone numbers we have on file and has not returned our calls. If patient calls back we will schedule them for their sleep study.  

## 2019-02-03 ENCOUNTER — Encounter: Payer: Self-pay | Admitting: Nurse Practitioner

## 2019-02-05 NOTE — L&D Delivery Note (Signed)
Delivery Note At  a viable female was delivered via  (Presentation:OA      ).  APGAR:8 ,9 ; weight  .   Placenta status:complete   ,  .3V  Cord:   with the following complications: None  .    Anesthesia:   Epideral Episiotomy:  None Lacerations:  1st vaginal with abraded tissue Suture Repair: 2.0 3.0 vicryl  Still oozing noted, pressure applied with ice glove, hemostasis noted Est. Blood Loss (mL):  8  Mom to postpartum.  Baby to Couplet care / Skin to Skin.  Pleas Koch Aymee Fomby 09/03/2019, 11:21 PM

## 2019-03-10 ENCOUNTER — Other Ambulatory Visit: Payer: Self-pay | Admitting: Nurse Practitioner

## 2019-03-10 DIAGNOSIS — G4700 Insomnia, unspecified: Secondary | ICD-10-CM

## 2019-03-10 MED ORDER — ESZOPICLONE 3 MG PO TABS: 3.0000 mg | ORAL_TABLET | Freq: Every day | ORAL | 0 refills | Status: DC

## 2019-03-10 MED FILL — CITALOPRAM HBR 20 MG TABLET: 20 | 30 days supply | Qty: 60 | Fill #2

## 2019-03-10 NOTE — Telephone Encounter (Signed)
Last 11/18/2018 She no showed her last visit I scheduled her for a visit next week.

## 2019-03-11 MED FILL — ESZOPICLONE 3 MG TABS: 3 | 30 days supply | Qty: 30 | Fill #0

## 2019-03-15 ENCOUNTER — Ambulatory Visit (INDEPENDENT_AMBULATORY_CARE_PROVIDER_SITE_OTHER): Payer: No Typology Code available for payment source | Admitting: Nurse Practitioner

## 2019-03-15 ENCOUNTER — Other Ambulatory Visit: Payer: Self-pay

## 2019-03-15 ENCOUNTER — Encounter: Payer: Self-pay | Admitting: Nurse Practitioner

## 2019-03-15 VITALS — BP 118/74 | HR 85 | Temp 98.5°F | Ht 60.0 in | Wt 197.8 lb

## 2019-03-15 DIAGNOSIS — G4700 Insomnia, unspecified: Secondary | ICD-10-CM

## 2019-03-15 DIAGNOSIS — Z3201 Encounter for pregnancy test, result positive: Secondary | ICD-10-CM

## 2019-03-15 DIAGNOSIS — N390 Urinary tract infection, site not specified: Secondary | ICD-10-CM

## 2019-03-15 DIAGNOSIS — F329 Major depressive disorder, single episode, unspecified: Secondary | ICD-10-CM | POA: Diagnosis not present

## 2019-03-15 DIAGNOSIS — F32A Depression, unspecified: Secondary | ICD-10-CM

## 2019-03-15 DIAGNOSIS — R300 Dysuria: Secondary | ICD-10-CM | POA: Diagnosis not present

## 2019-03-15 LAB — POCT URINALYSIS DIPSTICK
Bilirubin, UA: NEGATIVE
Blood, UA: NEGATIVE
Glucose, UA: NEGATIVE
Ketones, UA: NEGATIVE
Nitrite, UA: POSITIVE
Protein, UA: NEGATIVE
Spec Grav, UA: 1.025 (ref 1.010–1.025)
Urobilinogen, UA: 1 E.U./dL
pH, UA: 6 (ref 5.0–8.0)

## 2019-03-15 LAB — POCT URINE PREGNANCY: Preg Test, Ur: POSITIVE — AB

## 2019-03-15 MED ORDER — NITROFURANTOIN MONOHYD MACRO 100 MG PO CAPS: 100.0000 mg | ORAL_CAPSULE | Freq: Two times a day (BID) | ORAL | 0 refills | Status: DC

## 2019-03-15 MED ORDER — NITROFURANTOIN MONOHYD MACRO 100 MG PO CAPS: 100.0000 mg | ORAL_CAPSULE | Freq: Two times a day (BID) | ORAL | 0 refills | Status: AC

## 2019-03-15 MED FILL — NITROFURANTOIN MONO-MCR 100: 100 | 7 days supply | Qty: 14 | Fill #0

## 2019-03-15 NOTE — Progress Notes (Signed)
Subjective:     Patient ID: Kristen David , female    DOB: Apr 27, 1980 , 39 y.o.   MRN: [id number]   Chief Complaint  Patient presents with  . medication check    HPI  She is now pregnant approximately 14 weeks. She did 4 home pregnancy test.  She is still taking citalopram and she tried [place]. She has an appt with [employer]. She is more fatigued and morning sickness all day. She is back working at [employer]. LMP - December 05, 2019.   Urine also has an odor, denie pain with urination.  Has been going on for 5 days.   Insomnia Primary symptoms: no fragmented sleep, sleep disturbance.  The current episode started more than one year. The onset quality is gradual. The problem occurs every several days. Exacerbated by: pregnancy. PMH includes: associated symptoms present.  Constipation This is a new problem. The current episode started 1 to 4 weeks ago. The problem has been gradually worsening since onset. The patient is not on a high fiber diet. She does not exercise regularly. There has been adequate water intake. Pertinent negatives include no abdominal pain, fever, nausea or vomiting. Risk factors include obesity (pregnancy). She has tried laxatives for the symptoms. The treatment provided no relief. There is no history of abdominal surgery.     Past Medical History:  Diagnosis Date  . Anxiety   . Depression   . Hidradenitis suppurativa   . Tachycardia    "bouts of tachycardia" up to 130-140s, sometimes at rest but usually with anxiety     Family History  Problem Relation Age of Onset  . Diabetes Mother   . Cancer Mother        Breast  . Hypertension Father   . Gout Father      Current Outpatient Medications:  .  citalopram (CELEXA) 20 MG tablet, Take 1 tablet (20 mg total) by mouth at bedtime., Disp: 90 tablet, Rfl: 1 .  Eszopiclone 3 MG TABS, Take 1 tablet (3 mg total) by mouth at bedtime. Take immediately before bedtime, Disp: 30 tablet, Rfl: 0 .   buPROPion (WELLBUTRIN XL) 150 MG 24 hr tablet, Every morning - (Patient not taking: Reported on 03/15/2019), Disp: 30 tablet, Rfl: 2 .  ibuprofen (ADVIL,MOTRIN) 200 MG tablet, Take 800 mg by mouth every 6 (six) hours as needed for headache or moderate pain., Disp: , Rfl:    Allergies  Allergen Reactions  . Tramadol Nausea And Vomiting     Review of Systems  Constitutional: Negative.  Negative for fever.  Respiratory: Negative.  Negative for cough.   Cardiovascular: Negative.  Negative for chest pain, palpitations and leg swelling.  Gastrointestinal: Positive for constipation. Negative for abdominal pain, nausea and vomiting.  Neurological: Negative for dizziness and headaches.  Psychiatric/Behavioral: Positive for sleep disturbance. The patient has insomnia.      Today's Vitals   03/15/19 1606  BP: 118/74  Pulse: 85  Temp: 98.5 F (36.9 C)  TempSrc: Oral  Weight: 197 lb 12.8 oz (89.7 kg)  Height: 5' (1.524 m)   Body mass index is 38.63 kg/m.   Objective:  Physical Exam Constitutional:      Appearance: Normal appearance. She is obese.  Cardiovascular:     Rate and Rhythm: Normal rate and regular rhythm.     Pulses: Normal pulses.     Heart sounds: Normal heart sounds. No murmur.  Pulmonary:     Effort: Pulmonary effort is normal. No  respiratory distress.     Breath sounds: Normal breath sounds.  Skin:    Capillary Refill: Capillary refill takes less than 2 seconds.  Neurological:     General: No focal deficit present.     Mental Status: She is alert and oriented to person, place, and time.         Assessment And Plan:     1. Insomnia, unspecified type  I have advised her to discuss sleep aids with OB at her appt tomorrow   She is advised to stop taking Lunesta due to positive pregnancy test  She reports researching herself the different medications to take while pregnant for insomnia and depression  2. Depression, unspecified depression type  I have advised  her to stop her current medications and discuss with OB  3. Dysuria  Positive nitrates - POCT Urinalysis Dipstick [id number]) - POCT Urine Pregnancy  4. Urinary tract infection without hematuria, site unspecified  Positive nitrites, she is able to take nitrofuratoin while pregnant - Culture, Urine - nitrofurantoin, macrocrystal-monohydrate, (MACROBID) 100 MG capsule; Take 1 capsule (100 mg total) by mouth 2 (two) times daily for 5 days.  Dispense: 10 capsule; Refill: 0  5. Positive pregnancy test  She advised me during visit she is [redacted] weeks pregnant has not been seen by OB at this time   Minette Brine, FNP    THE PATIENT IS ENCOURAGED TO PRACTICE SOCIAL DISTANCING DUE TO THE COVID-19 PANDEMIC.

## 2019-03-15 NOTE — Patient Instructions (Signed)

## 2019-03-16 LAB — OB RESULTS CONSOLE RUBELLA ANTIBODY, IGM: Rubella: IMMUNE

## 2019-03-16 LAB — OB RESULTS CONSOLE RPR: RPR: NONREACTIVE

## 2019-03-16 LAB — HM PAP SMEAR: HM Pap smear: ABNORMAL

## 2019-03-16 LAB — OB RESULTS CONSOLE HIV ANTIBODY (ROUTINE TESTING): HIV: NONREACTIVE

## 2019-03-16 LAB — OB RESULTS CONSOLE HEPATITIS B SURFACE ANTIGEN: Hepatitis B Surface Ag: NEGATIVE

## 2019-03-17 LAB — URINE CULTURE

## 2019-03-22 MED FILL — NITROFURANTOIN MONO-MCR 100: 100 | 5 days supply | Qty: 10 | Fill #0

## 2019-03-25 MED FILL — SULFAMETHOXAZOLE-TMP DS TAB: 800-160 | 7 days supply | Qty: 14 | Fill #0

## 2019-05-04 ENCOUNTER — Other Ambulatory Visit (HOSPITAL_COMMUNITY): Payer: Self-pay | Admitting: Obstetrics and Gynecology

## 2019-05-04 DIAGNOSIS — Z3689 Encounter for other specified antenatal screening: Secondary | ICD-10-CM

## 2019-05-04 DIAGNOSIS — Z3A23 23 weeks gestation of pregnancy: Secondary | ICD-10-CM

## 2019-05-13 ENCOUNTER — Encounter: Payer: Self-pay | Admitting: Nurse Practitioner

## 2019-05-18 ENCOUNTER — Encounter (HOSPITAL_COMMUNITY): Payer: Self-pay | Admitting: *Deleted

## 2019-05-19 ENCOUNTER — Ambulatory Visit (HOSPITAL_COMMUNITY): Payer: No Typology Code available for payment source | Admitting: *Deleted

## 2019-05-19 ENCOUNTER — Encounter (HOSPITAL_COMMUNITY): Payer: Self-pay

## 2019-05-19 ENCOUNTER — Other Ambulatory Visit (HOSPITAL_COMMUNITY): Payer: Self-pay | Admitting: *Deleted

## 2019-05-19 ENCOUNTER — Ambulatory Visit (HOSPITAL_COMMUNITY)
Admission: RE | Admit: 2019-05-19 | Discharge: 2019-05-19 | Disposition: A | Discharge status: Home / Self Care | Payer: No Typology Code available for payment source | Source: Ambulatory Visit | Attending: Obstetrics and Gynecology | Admitting: Obstetrics and Gynecology

## 2019-05-19 ENCOUNTER — Other Ambulatory Visit: Payer: Self-pay

## 2019-05-19 VITALS — BP 128/71 | HR 92 | Temp 97.8°F

## 2019-05-19 DIAGNOSIS — O321XX0 Maternal care for breech presentation, not applicable or unspecified: Secondary | ICD-10-CM | POA: Insufficient documentation

## 2019-05-19 DIAGNOSIS — Z3689 Encounter for other specified antenatal screening: Secondary | ICD-10-CM | POA: Diagnosis present

## 2019-05-19 DIAGNOSIS — Z3A23 23 weeks gestation of pregnancy: Secondary | ICD-10-CM | POA: Diagnosis not present

## 2019-05-19 DIAGNOSIS — O3412 Maternal care for benign tumor of corpus uteri, second trimester: Secondary | ICD-10-CM | POA: Diagnosis not present

## 2019-05-19 DIAGNOSIS — O09522 Supervision of elderly multigravida, second trimester: Secondary | ICD-10-CM | POA: Insufficient documentation

## 2019-05-19 DIAGNOSIS — F1120 Opioid dependence, uncomplicated: Secondary | ICD-10-CM

## 2019-05-19 DIAGNOSIS — Z363 Encounter for antenatal screening for malformations: Secondary | ICD-10-CM | POA: Diagnosis not present

## 2019-05-19 DIAGNOSIS — O99320 Drug use complicating pregnancy, unspecified trimester: Secondary | ICD-10-CM

## 2019-05-19 DIAGNOSIS — D259 Leiomyoma of uterus, unspecified: Secondary | ICD-10-CM

## 2019-05-19 HISTORY — DX: Unspecified abnormal cytological findings in specimens from vagina: R87.629

## 2019-05-19 HISTORY — DX: Opioid dependence, in remission: F11.21

## 2019-05-19 HISTORY — DX: Benign neoplasm of connective and other soft tissue, unspecified: D21.9

## 2019-05-19 HISTORY — DX: Opioid dependence, uncomplicated: F11.20

## 2019-05-20 ENCOUNTER — Encounter (HOSPITAL_COMMUNITY): Payer: Self-pay | Admitting: Obstetrics & Gynecology

## 2019-05-21 ENCOUNTER — Other Ambulatory Visit (HOSPITAL_COMMUNITY): Payer: Self-pay | Admitting: Obstetrics and Gynecology

## 2019-05-21 MED FILL — PANTOPRAZOLE SOD DR 40 MG T: 40 | 30 days supply | Qty: 30 | Fill #0

## 2019-05-21 MED FILL — CITALOPRAM HBR 20 MG TABLET: 20 | 30 days supply | Qty: 30 | Fill #0

## 2019-05-21 MED FILL — METOCLOPRAMIDE 10 MG TABLET: 10 | 8 days supply | Qty: 30 | Fill #0

## 2019-05-21 MED FILL — ESZOPICLONE 3 MG TABS: 3 | 30 days supply | Qty: 30 | Fill #0

## 2019-05-21 MED FILL — ONDANSETRON HCL 4 MG TABLET: 4 | 10 days supply | Qty: 30 | Fill #0

## 2019-06-17 ENCOUNTER — Ambulatory Visit (HOSPITAL_COMMUNITY): Payer: No Typology Code available for payment source | Attending: Obstetrics and Gynecology

## 2019-06-17 ENCOUNTER — Ambulatory Visit: Payer: No Typology Code available for payment source

## 2019-06-21 ENCOUNTER — Encounter: Payer: Self-pay | Admitting: Nurse Practitioner

## 2019-07-06 ENCOUNTER — Encounter: Payer: Self-pay | Admitting: Nurse Practitioner

## 2019-07-06 MED FILL — CITALOPRAM HBR 20 MG TABLET: 20 | 30 days supply | Qty: 30 | Fill #1

## 2019-07-15 ENCOUNTER — Telehealth: Payer: Self-pay | Admitting: Hematology and Oncology

## 2019-07-15 NOTE — Telephone Encounter (Signed)
Received a new hem referral from Dr. Alwyn Pea at [facility] for anemia in pregnancy. Kristen David has been cld and scheduled to see Dr. Lorenso Courier on 6/11 at 2pm. Pt aware to arrive 15 minutes early.

## 2019-07-16 ENCOUNTER — Inpatient Hospital Stay (HOSPITAL_BASED_OUTPATIENT_CLINIC_OR_DEPARTMENT_OTHER): Payer: No Typology Code available for payment source | Admitting: Hematology and Oncology

## 2019-07-16 ENCOUNTER — Encounter: Payer: Self-pay | Admitting: Hematology and Oncology

## 2019-07-16 ENCOUNTER — Inpatient Hospital Stay: Payer: No Typology Code available for payment source | Attending: Hematology and Oncology

## 2019-07-16 ENCOUNTER — Other Ambulatory Visit: Payer: Self-pay

## 2019-07-16 VITALS — BP 115/61 | HR 92 | Temp 97.5°F | Resp 19 | Ht 60.0 in | Wt 209.1 lb

## 2019-07-16 DIAGNOSIS — Z8249 Family history of ischemic heart disease and other diseases of the circulatory system: Secondary | ICD-10-CM

## 2019-07-16 DIAGNOSIS — Z79899 Other long term (current) drug therapy: Secondary | ICD-10-CM

## 2019-07-16 DIAGNOSIS — F329 Major depressive disorder, single episode, unspecified: Secondary | ICD-10-CM | POA: Insufficient documentation

## 2019-07-16 DIAGNOSIS — D509 Iron deficiency anemia, unspecified: Secondary | ICD-10-CM | POA: Diagnosis not present

## 2019-07-16 DIAGNOSIS — Z803 Family history of malignant neoplasm of breast: Secondary | ICD-10-CM | POA: Diagnosis not present

## 2019-07-16 DIAGNOSIS — Z833 Family history of diabetes mellitus: Secondary | ICD-10-CM | POA: Insufficient documentation

## 2019-07-16 DIAGNOSIS — O99013 Anemia complicating pregnancy, third trimester: Secondary | ICD-10-CM

## 2019-07-16 LAB — CMP (CANCER CENTER ONLY)
ALT: <6 U/L (ref 0–44)
AST: 9 U/L — ABNORMAL LOW (ref 15–41)
Albumin: 2.4 g/dL — ABNORMAL LOW (ref 3.5–5.0)
Alkaline Phosphatase: 143 U/L — ABNORMAL HIGH (ref 38–126)
Anion gap: 11 (ref 5–15)
BUN: <4 mg/dL — ABNORMAL LOW (ref 6–20)
CO2: 22 mmol/L (ref 22–32)
Calcium: 8.1 mg/dL — ABNORMAL LOW (ref 8.9–10.3)
Chloride: 106 mmol/L (ref 98–111)
Creatinine: 0.73 mg/dL (ref 0.44–1.00)
GFR, Est AFR Am: >60 mL/min (ref >60)
GFR, Estimated: >60 mL/min (ref >60)
Glucose, Bld: 97 mg/dL (ref 70–99)
Potassium: 3.7 mmol/L (ref 3.5–5.1)
Sodium: 139 mmol/L (ref 135–145)
Total Bilirubin: 0.4 mg/dL (ref 0.3–1.2)
Total Protein: 6.7 g/dL (ref 6.5–8.1)

## 2019-07-16 LAB — CBC WITH DIFFERENTIAL (CANCER CENTER ONLY)
Abs Immature Granulocytes: 0.19 10*3/uL — ABNORMAL HIGH (ref 0.00–0.07)
Basophils Absolute: 0 10*3/uL (ref 0.0–0.1)
Basophils Relative: 0 %
Eosinophils Absolute: 0.3 10*3/uL (ref 0.0–0.5)
Eosinophils Relative: 3 %
HCT: 24.5 % — ABNORMAL LOW (ref 36.0–46.0)
Hemoglobin: 7.4 g/dL — ABNORMAL LOW (ref 12.0–15.0)
Immature Granulocytes: 2 %
Lymphocytes Relative: 16 %
Lymphs Abs: 1.3 10*3/uL (ref 0.7–4.0)
MCH: 23.6 pg — ABNORMAL LOW (ref 26.0–34.0)
MCHC: 30.2 g/dL (ref 30.0–36.0)
MCV: 78.3 fL — ABNORMAL LOW (ref 80.0–100.0)
Monocytes Absolute: 0.6 10*3/uL (ref 0.1–1.0)
Monocytes Relative: 8 %
Neutro Abs: 6 10*3/uL (ref 1.7–7.7)
Neutrophils Relative %: 71 %
Platelet Count: 341 10*3/uL (ref 150–400)
RBC: 3.13 MIL/uL — ABNORMAL LOW (ref 3.87–5.11)
RDW: 18.6 % — ABNORMAL HIGH (ref 11.5–15.5)
WBC Count: 8.4 10*3/uL (ref 4.0–10.5)
nRBC: 0 % (ref 0.0–0.2)

## 2019-07-16 LAB — RETIC PANEL
Immature Retic Fract: 39.2 % — ABNORMAL HIGH (ref 2.3–15.9)
RBC.: 3.17 MIL/uL — ABNORMAL LOW (ref 3.87–5.11)
Retic Count, Absolute: 58.3 10*3/uL (ref 19.0–186.0)
Retic Ct Pct: 1.8 % (ref 0.4–3.1)
Reticulocyte Hemoglobin: 27.3 pg — ABNORMAL LOW (ref >27.9)

## 2019-07-16 LAB — FOLATE: Folate: 9.2 ng/mL (ref >5.9)

## 2019-07-16 LAB — VITAMIN B12: Vitamin B-12: 394 pg/mL (ref 180–914)

## 2019-07-16 NOTE — Progress Notes (Signed)
[place] [phone]   Fax:([phone]) [place] NOTE  Patient Care Team: Glendale Chard, MD as PCP - General (Internal Medicine)  Hematological/Oncological History # Microcytic Anemia in Pregnancy 1) 03/16/2019: Hgb 11.0, MCV 79, Plt 463 2) 04/01/2019: Hgb 9.9, Plt 436, MCV 80 3) 07/02/2019: Hgb 8.0, MCV 76, Plt 327 2) 07/16/2019: establish care with Dr. Lorenso Courier  CHIEF COMPLAINTS/PURPOSE OF CONSULTATION:  "Anemia in Pregnancy "  HISTORY OF PRESENTING ILLNESS:  Kristen David 39 y.o. female with medical history significant for hidradenitis, narcotic addiction, fibroids, and anxiety who presents for evaluation of microcytic anemia in pregnancy.   On review of the previous records Kristen David had a CBC performed on 09/10/2017 at which time she was noted to have a hemoglobin 11.1, MCV of 83.8, and a platelet count of 512.  More recently as part of her prenatal evaluations the patient was noted to have a hemoglobin 11, MCV 79, and a platelet count of 463 on 03/16/2019.  She was found to have a considerable drop on 04/01/2019 at a hemoglobin of 9.9.  Most recently on 07/02/2019 the patient was on have hemoglobin 8.0, MCV 76, and a platelet count of 327.  She has been taking p.o. iron supplementation in the interim but has been having GI symptoms.  Due to concern for iron deficiency anemia in pregnancy the patient was referred to hematology for further evaluation management.  On exam today Kristen David notes that she has been having trouble with fatigue during this pregnancy.  She reports that she is a longstanding history of iron deficiency anemia and that she is "always had low iron.  She notes that she is currently on iron pills but they make her sick on her stomach.  She notes that she had been put on iron pills before in the past but rarely continue taking them because they were causing GI upset.  She notes that that occurred most recently approximately 1 year  ago.  In terms of the cause of iron deficiency anemia she notes that she did have heavy menstrual cycles prior to becoming pregnant.  She notes that her cycles are regular occurring every 28 days but typically lasted 4 to 5 days.  She notes that she was a teenager they would last up to 7 days.  She notes that her heaviest days are the 2nd-4th day and that she will typically go through 3-4 pads a day during that time.  She notes that she eats some red meat, but only about 3-4 times per week.  She notes that she does not eat a whole lot in the way of veggies.  On further discussion she notes that she does not have any family history remarkable for bleeding conditions or bleeding disorders.  She notes that her father is on Coumadin for having developed a blood clot and currently has a filter in place and that her mother has anemia and underwent hysterectomy due to the presence of fibroids.  She is a never smoker and does not currently drink alcohol while pregnant.  She currently denies having any issues with fevers, chills, sweats, nausea, vomiting or diarrhea.  She does it is not no overt signs of bleeding such as nosebleeds, bruising, or dark stools.  A full 10 point ROS is listed below.  MEDICAL HISTORY:  Past Medical History:  Diagnosis Date  . Anxiety   . Depression   . Fibroid   . Hidradenitis suppurativa   . History of  narcotic addiction ([place])   . Suboxone maintenance treatment complicating pregnancy, antepartum ([place])   . Tachycardia    "bouts of tachycardia" up to 130-140s, sometimes at rest but usually with anxiety  . Vaginal Pap smear, abnormal     SURGICAL HISTORY: Past Surgical History:  Procedure Laterality Date  . HYDRADENITIS EXCISION     bilateral axilla  . HYDRADENITIS EXCISION Left 09/12/2017   Procedure: EXCISION OF LEFT INNER THIGH HIDRADENITIS;  Surgeon: Clovis Riley, MD;  Location: [facility];  Service: General;  Laterality: Left;  . WISDOM TOOTH EXTRACTION      SOCIAL  HISTORY: Social History   Socioeconomic History  . Marital status: Single    Spouse name: Not on file  . Number of children: Not on file  . Years of education: Not on file  . Highest education level: Not on file  Occupational History  . Not on file  Tobacco Use  . Smoking status: Never Smoker  . Smokeless tobacco: Never Used  Vaping Use  . Vaping Use: Never used  Substance and Sexual Activity  . Alcohol use: Yes    Comment: 2-3 drinks a week  . Drug use: Yes    Types: Other-see comments    Comment: Hx narcotic addiction, subutex current  . Sexual activity: Yes    Birth control/protection: None  Other Topics Concern  . Not on file  Social History Narrative  . Not on file   Social Determinants of Health   Financial Resource Strain:   . Difficulty of Paying Living Expenses:   Food Insecurity:   . Worried About Charity fundraiser in the Last Year:   . Arboriculturist in the Last Year:   Transportation Needs:   . Film/video editor (Medical):   Marland Kitchen Lack of Transportation (Non-Medical):   Physical Activity:   . Days of Exercise per Week:   . Minutes of Exercise per Session:   Stress:   . Feeling of Stress :   Social Connections:   . Frequency of Communication with Friends and Family:   . Frequency of Social Gatherings with Friends and Family:   . Attends Religious Services:   . Active Member of Clubs or Organizations:   . Attends Archivist Meetings:   Marland Kitchen Marital Status:   Intimate Partner Violence:   . Fear of Current or Ex-Partner:   . Emotionally Abused:   Marland Kitchen Physically Abused:   . Sexually Abused:     FAMILY HISTORY: Family History  Problem Relation Age of Onset  . Diabetes Mother   . Cancer Mother        Breast  . Hypertension Father   . Gout Father     ALLERGIES:  is allergic to tramadol.  MEDICATIONS:  Current Outpatient Medications  Medication Sig Dispense Refill  . buprenorphine (SUBUTEX) 8 MG SUBL SL tablet Place 8 mg under the  tongue in the morning and at bedtime.     . citalopram (CELEXA) 20 MG tablet Take 20 mg by mouth daily.    . eszopiclone (LUNESTA) 1 MG TABS tablet Take 1 mg by mouth at bedtime as needed for sleep. Take immediately before bedtime    . ferrous sulfate 325 (65 FE) MG tablet Take 325 mg by mouth daily with breakfast.    . metoCLOPramide (REGLAN) 10 MG tablet Take 10 mg by mouth 4 (four) times daily as needed.    . ondansetron (ZOFRAN) 4 MG tablet Take 4 mg by  mouth 3 (three) times daily as needed.    . pantoprazole (PROTONIX) 40 MG tablet Take 40 mg by mouth daily.    . Prenatal MV-Min-FA-Omega-3 (PRENATAL GUMMIES/DHA & FA) 0.4-32.5 MG CHEW Chew by mouth.     No current facility-administered medications for this visit.    REVIEW OF SYSTEMS:   Constitutional: ( - ) fevers, ( - )  chills , ( - ) night sweats Eyes: ( - ) blurriness of vision, ( - ) double vision, ( - ) watery eyes Ears, nose, mouth, throat, and face: ( - ) mucositis, ( - ) sore throat Respiratory: ( - ) cough, ( - ) dyspnea, ( - ) wheezes Cardiovascular: ( - ) palpitation, ( - ) chest discomfort, ( - ) lower extremity swelling Gastrointestinal:  ( - ) nausea, ( - ) heartburn, ( - ) change in bowel habits Skin: ( - ) abnormal skin rashes Lymphatics: ( - ) new lymphadenopathy, ( - ) easy bruising Neurological: ( - ) numbness, ( - ) tingling, ( - ) new weaknesses Behavioral/Psych: ( - ) mood change, ( - ) new changes  All other systems were reviewed with the patient and are negative.  PHYSICAL EXAMINATION: ECOG PERFORMANCE STATUS: 1 - Symptomatic but completely ambulatory  Vitals:   07/16/19 1403  BP: 115/61  Pulse: 92  Resp: 19  Temp: (!) 97.5 F (36.4 C)  SpO2: 100%   Filed Weights   07/16/19 1403  Weight: 209 lb 1.6 oz (94.8 kg)    GENERAL: well appearing young African American female in NAD  SKIN: skin color, texture, turgor are normal, no rashes or significant lesions EYES: conjunctiva are pink and  non-injected, sclera clear LUNGS: clear to auscultation and percussion with normal breathing effort HEART: regular rate & rhythm and no murmurs and no lower extremity edema Musculoskeletal: no cyanosis of digits and no clubbing  PSYCH: alert & oriented x 3, fluent speech NEURO: no focal motor/sensory deficits  LABORATORY DATA:  I have reviewed the data as listed CBC Latest Ref Rng & Units 07/16/2019 09/10/2017 04/07/2017  WBC 4.0 - 10.5 K/uL 8.4 6.4 7.3  Hemoglobin 12.0 - 15.0 g/dL 7.4(L) 11.1(L) 11.8(A)  Hematocrit 36 - 46 % 24.5(L) 36.7 38  Platelets 150 - 400 K/uL 341 512(H) 568(A)    CMP Latest Ref Rng & Units 03/11/2018 09/10/2017 04/07/2017  Glucose 65 - 99 mg/dL 113(H) 89 -  BUN 6 - 20 mg/dL 9 8 11   Creatinine 0.57 - 1.00 mg/dL 0.99 1.02(H) 1.1  Sodium 134 - 144 mmol/L 141 138 140  Potassium 3.5 - 5.2 mmol/L 4.4 3.8 4.7  Chloride 96 - 106 mmol/L 103 102 -  CO2 20 - 29 mmol/L 23 26 -  Calcium 8.7 - 10.2 mg/dL 9.1 9.0 -  Total Protein 6.0 - 8.5 g/dL 7.1 - -  Total Bilirubin 0.0 - 1.2 mg/dL <0.2 - -  Alkaline Phos 39 - 117 IU/L 89 - 94  AST 0 - 40 IU/L 18 - 13  ALT 0 - 32 IU/L 12 - 8   RADIOGRAPHIC STUDIES: No results found.  ASSESSMENT & PLAN Kristen Royal Saldivar 39 y.o. female with medical history significant for hidradenitis, narcotic addiction, fibroids, and anxiety who presents for evaluation of microcytic anemia in pregnancy.  After review the labs, review the outside records, discussion with the patient the findings are most consistent with an iron deficiency anemia in pregnancy.  Unfortunately we do not have records of iron studies, however the  patient does have a microcytic anemia and reports that she has been noted to be iron deficient before in the past and has never been on iron pills for an extended period of time.  The most likely cause of her iron deficiency anemia are the heavy menstrual cycle she had prior to becoming pregnant.  Additionally she has a history of fibroids and  has never been on iron supplementation.  This is all likely compounded by the pregnancy and the increase in fluid causing a considerable drop in her hemoglobin.  Given that the patient is in the third trimester pregnancy I would recommend proceeding forward with IV Feraheme 5 to 10 mg q. 7 days x 2 doses for her iron deficiency anemia.  We will plan to have this set up with the day hospital as this is what is required for patients or pregnant.  This will hopefully replete her iron stores in time for delivery.  In the event the patient does not have a reasonably elevated hemoglobin at the time of delivery please do consult the hematology service for further evaluation.  Fortunately I do believe we have enough time to raise her hemoglobin to a relatively normal level prior to delivery.  The patient voiced her understanding of this plan moving forward.  #Microcytic Anemia in Pregnancy --today will order iron panel, ferritin, and reticulocyte panel to confirm iron deficiency anemia as the diagnosis.  --additionally will collect CBC, CMP --if found to be iron deficient the patient will require IV iron administration --recommend IV feraheme 510mg  q7 days x 2 doses if iron deficiency is noted on today's labs --patient may d/c PO ferrous sulfate 325mg  as this late in pregnancy it can cause worsening constipation/ GI upset --recommend consult to hematology if the patient's hemoglobin remains low at time of delivery. Please check CBC prior to delivery.  --plan to have the patient return to clinic approximately 2 months after delivery or sooner if she develops new or worsening symptoms in the interim. (we typically see patients 4-6 weeks after IV iron, though in her case that would be right around the time of delivery)  Orders Placed This Encounter  Procedures  . CBC with Differential (Cancer Center Only)    Standing Status:   Future    Number of Occurrences:   1    Standing Expiration Date:   07/15/2020  .  Retic Panel    Standing Status:   Future    Number of Occurrences:   1    Standing Expiration Date:   07/15/2020  . CMP ([employer] only)    Standing Status:   Future    Number of Occurrences:   1    Standing Expiration Date:   07/15/2020  . Iron and TIBC    Standing Status:   Future    Number of Occurrences:   1    Standing Expiration Date:   07/15/2020  . Ferritin    Standing Status:   Future    Number of Occurrences:   1    Standing Expiration Date:   07/15/2020  . Vitamin B12    Standing Status:   Future    Number of Occurrences:   1    Standing Expiration Date:   07/15/2020  . Folate, Serum    Standing Status:   Future    Number of Occurrences:   1    Standing Expiration Date:   07/15/2020    All questions were answered. The patient knows to  call the clinic with any problems, questions or concerns.  A total of more than 45 minutes were spent on this encounter and over half of that time was spent on counseling and coordination of care as outlined above.   Ledell Peoples, MD Department of Hematology/Oncology [facility] Phone: [phone] Pager: [phone] Email: [email or web address]  07/16/2019 3:29 PM

## 2019-07-19 ENCOUNTER — Telehealth: Payer: Self-pay | Admitting: Hematology and Oncology

## 2019-07-19 LAB — IRON AND TIBC
Iron: 29 ug/dL — ABNORMAL LOW (ref 41–142)
Saturation Ratios: 6 % — ABNORMAL LOW (ref 21–57)
TIBC: 500 ug/dL — ABNORMAL HIGH (ref 236–444)
UIBC: 471 ug/dL — ABNORMAL HIGH (ref 120–384)

## 2019-07-19 LAB — FERRITIN: Ferritin: 13 ng/mL (ref 11–307)

## 2019-07-19 NOTE — Telephone Encounter (Signed)
Scheduled per los. Called and left msg. Mailed printout  °

## 2019-07-23 ENCOUNTER — Other Ambulatory Visit: Payer: Self-pay

## 2019-07-23 ENCOUNTER — Ambulatory Visit (HOSPITAL_COMMUNITY)
Admission: RE | Admit: 2019-07-23 | Discharge: 2019-07-23 | Disposition: A | Discharge status: Home / Self Care | Payer: No Typology Code available for payment source | Source: Ambulatory Visit | Attending: Hematology and Oncology | Admitting: Hematology and Oncology

## 2019-07-23 DIAGNOSIS — O99013 Anemia complicating pregnancy, third trimester: Secondary | ICD-10-CM | POA: Diagnosis present

## 2019-07-23 MED ORDER — SODIUM CHLORIDE 0.9 % IV SOLN
510.0000 mg | INTRAVENOUS | Status: DC
  Administered 2019-07-23: 510 mg via INTRAVENOUS
  Filled 2019-07-23: qty 17

## 2019-07-23 NOTE — Discharge Instructions (Signed)

## 2019-07-30 ENCOUNTER — Other Ambulatory Visit: Payer: Self-pay

## 2019-07-30 ENCOUNTER — Encounter (HOSPITAL_COMMUNITY)
Admission: RE | Admit: 2019-07-30 | Discharge: 2019-07-30 | Disposition: A | Discharge status: Home / Self Care | Payer: No Typology Code available for payment source | Source: Ambulatory Visit | Attending: Hematology and Oncology | Admitting: Hematology and Oncology

## 2019-07-30 DIAGNOSIS — O99013 Anemia complicating pregnancy, third trimester: Secondary | ICD-10-CM | POA: Diagnosis present

## 2019-07-30 MED ORDER — SODIUM CHLORIDE 0.9 % IV SOLN
510.0000 mg | INTRAVENOUS | Status: AC
  Administered 2019-07-30: 510 mg via INTRAVENOUS
  Filled 2019-07-30: qty 17

## 2019-08-04 MED FILL — METOCLOPRAMIDE 10 MG TABLET: 10 | 8 days supply | Qty: 30 | Fill #1

## 2019-08-04 MED FILL — PANTOPRAZOLE SOD DR 40 MG T: 40 | 30 days supply | Qty: 30 | Fill #1

## 2019-08-04 MED FILL — ESZOPICLONE 3 MG TABS: 3 | 30 days supply | Qty: 30 | Fill #1

## 2019-08-19 MED FILL — PROMETHAZINE 25 MG TABLET: 25 | 5 days supply | Qty: 20 | Fill #0

## 2019-08-24 MED FILL — CITALOPRAM HBR 20 MG TABLET: 20 | 30 days supply | Qty: 30 | Fill #2

## 2019-09-02 ENCOUNTER — Observation Stay (HOSPITAL_BASED_OUTPATIENT_CLINIC_OR_DEPARTMENT_OTHER): Payer: No Typology Code available for payment source

## 2019-09-02 ENCOUNTER — Inpatient Hospital Stay (HOSPITAL_COMMUNITY)
Admission: AD | Admit: 2019-09-02 | Discharge: 2019-09-05 | DRG: 806 | Disposition: A | Discharge status: Home / Self Care | Payer: No Typology Code available for payment source | Attending: Obstetrics and Gynecology | Admitting: Obstetrics and Gynecology

## 2019-09-02 ENCOUNTER — Encounter (HOSPITAL_COMMUNITY): Payer: Self-pay | Admitting: Obstetrics & Gynecology

## 2019-09-02 ENCOUNTER — Other Ambulatory Visit: Payer: Self-pay

## 2019-09-02 DIAGNOSIS — D259 Leiomyoma of uterus, unspecified: Secondary | ICD-10-CM | POA: Diagnosis present

## 2019-09-02 DIAGNOSIS — O99354 Diseases of the nervous system complicating childbirth: Secondary | ICD-10-CM | POA: Diagnosis present

## 2019-09-02 DIAGNOSIS — D62 Acute posthemorrhagic anemia: Secondary | ICD-10-CM | POA: Diagnosis not present

## 2019-09-02 DIAGNOSIS — O9902 Anemia complicating childbirth: Secondary | ICD-10-CM

## 2019-09-02 DIAGNOSIS — Z3A38 38 weeks gestation of pregnancy: Secondary | ICD-10-CM | POA: Diagnosis not present

## 2019-09-02 DIAGNOSIS — O3413 Maternal care for benign tumor of corpus uteri, third trimester: Principal | ICD-10-CM | POA: Diagnosis present

## 2019-09-02 DIAGNOSIS — O283 Abnormal ultrasonic finding on antenatal screening of mother: Secondary | ICD-10-CM

## 2019-09-02 DIAGNOSIS — O289 Unspecified abnormal findings on antenatal screening of mother: Secondary | ICD-10-CM | POA: Diagnosis not present

## 2019-09-02 DIAGNOSIS — F1910 Other psychoactive substance abuse, uncomplicated: Secondary | ICD-10-CM | POA: Diagnosis present

## 2019-09-02 DIAGNOSIS — G4700 Insomnia, unspecified: Secondary | ICD-10-CM | POA: Diagnosis present

## 2019-09-02 DIAGNOSIS — O26893 Other specified pregnancy related conditions, third trimester: Secondary | ICD-10-CM | POA: Diagnosis present

## 2019-09-02 DIAGNOSIS — F1120 Opioid dependence, uncomplicated: Secondary | ICD-10-CM

## 2019-09-02 DIAGNOSIS — O99324 Drug use complicating childbirth: Secondary | ICD-10-CM | POA: Diagnosis present

## 2019-09-02 DIAGNOSIS — Z20822 Contact with and (suspected) exposure to covid-19: Secondary | ICD-10-CM | POA: Diagnosis present

## 2019-09-02 DIAGNOSIS — O99214 Obesity complicating childbirth: Secondary | ICD-10-CM | POA: Diagnosis present

## 2019-09-02 DIAGNOSIS — O09513 Supervision of elderly primigravida, third trimester: Secondary | ICD-10-CM | POA: Diagnosis not present

## 2019-09-02 DIAGNOSIS — L732 Hidradenitis suppurativa: Secondary | ICD-10-CM | POA: Diagnosis present

## 2019-09-02 DIAGNOSIS — Z79891 Long term (current) use of opiate analgesic: Secondary | ICD-10-CM

## 2019-09-02 DIAGNOSIS — O99344 Other mental disorders complicating childbirth: Secondary | ICD-10-CM | POA: Diagnosis present

## 2019-09-02 DIAGNOSIS — O9081 Anemia of the puerperium: Secondary | ICD-10-CM | POA: Diagnosis not present

## 2019-09-02 DIAGNOSIS — F329 Major depressive disorder, single episode, unspecified: Secondary | ICD-10-CM | POA: Diagnosis present

## 2019-09-02 DIAGNOSIS — O99323 Drug use complicating pregnancy, third trimester: Secondary | ICD-10-CM

## 2019-09-02 DIAGNOSIS — Z3490 Encounter for supervision of normal pregnancy, unspecified, unspecified trimester: Secondary | ICD-10-CM | POA: Diagnosis present

## 2019-09-02 LAB — CBC WITH DIFFERENTIAL/PLATELET
Abs Immature Granulocytes: 0.12 10*3/uL — ABNORMAL HIGH (ref 0.00–0.07)
Basophils Absolute: 0 10*3/uL (ref 0.0–0.1)
Basophils Relative: 0 %
Eosinophils Absolute: 0.2 10*3/uL (ref 0.0–0.5)
Eosinophils Relative: 3 %
HCT: 31.9 % — ABNORMAL LOW (ref 36.0–46.0)
Hemoglobin: 9.7 g/dL — ABNORMAL LOW (ref 12.0–15.0)
Immature Granulocytes: 2 %
Lymphocytes Relative: 19 %
Lymphs Abs: 1.4 10*3/uL (ref 0.7–4.0)
MCH: 26.4 pg (ref 26.0–34.0)
MCHC: 30.4 g/dL (ref 30.0–36.0)
MCV: 86.7 fL (ref 80.0–100.0)
Monocytes Absolute: 0.5 10*3/uL (ref 0.1–1.0)
Monocytes Relative: 7 %
Neutro Abs: 4.9 10*3/uL (ref 1.7–7.7)
Neutrophils Relative %: 69 %
Platelets: 326 10*3/uL (ref 150–400)
RBC: 3.68 MIL/uL — ABNORMAL LOW (ref 3.87–5.11)
RDW: 23.9 % — ABNORMAL HIGH (ref 11.5–15.5)
WBC: 6.9 10*3/uL (ref 4.0–10.5)
nRBC: 0 % (ref 0.0–0.2)

## 2019-09-02 LAB — OB RESULTS CONSOLE GBS: GBS: NEGATIVE

## 2019-09-02 LAB — SARS CORONAVIRUS 2 BY RT PCR (HOSPITAL ORDER, PERFORMED IN ~~LOC~~ HOSPITAL LAB): SARS Coronavirus 2: NEGATIVE

## 2019-09-02 LAB — TYPE AND SCREEN
ABO/RH(D): B POS
Antibody Screen: NEGATIVE

## 2019-09-02 LAB — ABO/RH: ABO/RH(D): B POS

## 2019-09-02 MED ORDER — LACTATED RINGERS IV BOLUS
500.0000 mL | Freq: Once | INTRAVENOUS | Status: AC
  Administered 2019-09-02: 500 mL via INTRAVENOUS

## 2019-09-02 MED ORDER — SOD CITRATE-CITRIC ACID 500-334 MG/5ML PO SOLN: 30.0000 mL | ORAL | Status: DC | PRN

## 2019-09-02 MED ORDER — OXYTOCIN BOLUS FROM INFUSION
333.0000 mL | Freq: Once | INTRAVENOUS | Status: AC
  Administered 2019-09-03: 333 mL via INTRAVENOUS

## 2019-09-02 MED ORDER — CITALOPRAM HYDROBROMIDE 20 MG PO TABS
20.0000 mg | ORAL_TABLET | Freq: Every day | ORAL | Status: DC
  Administered 2019-09-02 – 2019-09-04 (×3): 20 mg via ORAL
  Filled 2019-09-02: qty 2
  Filled 2019-09-02 (×2): qty 1

## 2019-09-02 MED ORDER — TERBUTALINE SULFATE 1 MG/ML IJ SOLN: 0.2500 mg | Freq: Once | INTRAMUSCULAR | Status: DC | PRN

## 2019-09-02 MED ORDER — LACTATED RINGERS IV SOLN
500.0000 mL | INTRAVENOUS | Status: DC | PRN
  Administered 2019-09-03: 500 mL via INTRAVENOUS

## 2019-09-02 MED ORDER — ACETAMINOPHEN 325 MG PO TABS
650.0000 mg | ORAL_TABLET | ORAL | Status: DC | PRN
  Administered 2019-09-03 (×2): 650 mg via ORAL

## 2019-09-02 MED ORDER — ZOLPIDEM TARTRATE 5 MG PO TABS
5.0000 mg | ORAL_TABLET | Freq: Every evening | ORAL | Status: DC | PRN
  Administered 2019-09-02: 5 mg via ORAL
  Filled 2019-09-02: qty 1

## 2019-09-02 MED ORDER — BUPRENORPHINE HCL-NALOXONE HCL 8-2 MG SL SUBL
1.0000 | SUBLINGUAL_TABLET | Freq: Two times a day (BID) | SUBLINGUAL | Status: DC
  Administered 2019-09-02 – 2019-09-03 (×2): 1 via SUBLINGUAL
  Filled 2019-09-02 (×2): qty 1

## 2019-09-02 MED ORDER — LACTATED RINGERS IV SOLN: INTRAVENOUS | Status: DC

## 2019-09-02 MED ORDER — MISOPROSTOL 25 MCG QUARTER TABLET
25.0000 ug | ORAL_TABLET | ORAL | Status: DC | PRN
  Administered 2019-09-02 – 2019-09-03 (×2): 25 ug via VAGINAL
  Filled 2019-09-02 (×2): qty 1

## 2019-09-02 MED ORDER — PRENATAL MULTIVITAMIN CH: 1.0000 | ORAL_TABLET | Freq: Every day | ORAL | Status: DC

## 2019-09-02 MED ORDER — CALCIUM CARBONATE ANTACID 500 MG PO CHEW: 2.0000 | CHEWABLE_TABLET | ORAL | Status: DC | PRN

## 2019-09-02 MED ORDER — LIDOCAINE HCL (PF) 1 % IJ SOLN: 30.0000 mL | INTRAMUSCULAR | Status: DC | PRN

## 2019-09-02 MED ORDER — DOCUSATE SODIUM 100 MG PO CAPS: 100.0000 mg | ORAL_CAPSULE | Freq: Every day | ORAL | Status: DC

## 2019-09-02 MED ORDER — OXYTOCIN-SODIUM CHLORIDE 30-0.9 UT/500ML-% IV SOLN: 2.5000 [IU]/h | INTRAVENOUS | Status: DC

## 2019-09-02 MED ORDER — ACETAMINOPHEN 325 MG PO TABS
650.0000 mg | ORAL_TABLET | ORAL | Status: DC | PRN
  Filled 2019-09-02 (×2): qty 2

## 2019-09-02 MED ORDER — ONDANSETRON HCL 4 MG/2ML IJ SOLN: 4.0000 mg | Freq: Four times a day (QID) | INTRAMUSCULAR | Status: DC | PRN

## 2019-09-02 MED ORDER — OXYCODONE-ACETAMINOPHEN 5-325 MG PO TABS: 1.0000 | ORAL_TABLET | ORAL | Status: DC | PRN

## 2019-09-02 MED ORDER — OXYCODONE-ACETAMINOPHEN 5-325 MG PO TABS: 2.0000 | ORAL_TABLET | ORAL | Status: DC | PRN

## 2019-09-02 MED ORDER — FENTANYL CITRATE (PF) 100 MCG/2ML IJ SOLN
50.0000 ug | INTRAMUSCULAR | Status: DC | PRN
  Administered 2019-09-03: 50 ug via INTRAVENOUS
  Filled 2019-09-02: qty 2

## 2019-09-02 MED ORDER — OXYTOCIN-SODIUM CHLORIDE 30-0.9 UT/500ML-% IV SOLN: 1.0000 m[IU]/min | INTRAVENOUS | Status: DC

## 2019-09-02 NOTE — H&P (Addendum)
Kristen David is a 39 y.o. female, G3P1011, IUP at 38.5 weeks, presenting for admission to high risk OB for BPP of 6/10 (Off for breathing and tone). Low risk female. 7/8 [place] EFW 5.15lbs. Pt endorse + Fm. Denies vaginal leakage. Denies vaginal bleeding. Denies feeling cxt's.   Pregnancy Problems advanced maternal age gravida (Low risk Panorama) Anemia (Hgb 9.9 at NOB, Hb 8 now) atypical squamous cells of undetermined significance (ASCUS, positive HPV on pap 03/16/19, plan colpo, may defer until pp) depressive disorder (Citalopram 30 mg q day) hidradenitis suppurativa (Reports she had severe pain from HS that required narcotics to the point of dependence, now on Subutex (buprenorphine). history of substance abuse (Narcotics. On subutex.) infection due to Escherichia coli (On NOB urine, rx'd, test of cure negative) Insomnia (Lunesta, elects to continue due to severe insomnia) left ventricular wall echogenicity (Noted on anatomy US, normal Panorama) Polyhydramnios (On BPP - AFI 26.2, resolved currently 13.7) Thrombocytosis (Platelet 463 on 03/16/19--recheck 04/01/19 436. Recheck at [facility].) uterine leiomyoma (x 2, small (2 cm and 1 cm)  Medications buprenorphine HCl citalopram eszopiclone metoclopramide HCl ondansetron HCl pantoprazole Prenatal Gummies promethazine  Patient Active Problem List   Diagnosis Date Noted  . Abnormal ultrasonic finding on antenatal screening of mother, antepartum 09/02/2019  . Abnormal prenatal ultrasound 09/02/2019  . Depression 03/15/2019  . Insomnia 12/09/2018  . Abnormal weight gain 03/14/2018  . Tachycardia 03/14/2018  . Class 2 severe obesity due to excess calories with serious comorbidity and body mass index (BMI) of 39.0 to 39.9 in adult ([place]) 03/14/2018  . Paresthesia of both hands 03/14/2018  . Hidradenitis suppurativa-groin 05/21/2012     Medications Prior to Admission  Medication Sig Dispense Refill Last Dose  . buprenorphine (SUBUTEX) 8  MG SUBL SL tablet Place 8 mg under the tongue in the morning and at bedtime.      . citalopram (CELEXA) 20 MG tablet Take 20 mg by mouth daily.     . eszopiclone (LUNESTA) 1 MG TABS tablet Take 1 mg by mouth at bedtime as needed for sleep. Take immediately before bedtime     . ferrous sulfate 325 (65 FE) MG tablet Take 325 mg by mouth daily with breakfast.     . metoCLOPramide (REGLAN) 10 MG tablet Take 10 mg by mouth 4 (four) times daily as needed.     . ondansetron (ZOFRAN) 4 MG tablet Take 4 mg by mouth 3 (three) times daily as needed.     . pantoprazole (PROTONIX) 40 MG tablet Take 40 mg by mouth daily.     . Prenatal MV-Min-FA-Omega-3 (PRENATAL GUMMIES/DHA & FA) 0.4-32.5 MG CHEW Chew by mouth.       Past Medical History:  Diagnosis Date  . Anxiety   . Depression   . Fibroid   . Hidradenitis suppurativa   . History of narcotic addiction ([place])   . Suboxone maintenance treatment complicating pregnancy, antepartum ([place])   . Tachycardia    "bouts of tachycardia" up to 130-140s, sometimes at rest but usually with anxiety  . Vaginal Pap smear, abnormal      No current facility-administered medications on file prior to encounter.   Current Outpatient Medications on File Prior to Encounter  Medication Sig Dispense Refill  . buprenorphine (SUBUTEX) 8 MG SUBL SL tablet Place 8 mg under the tongue in the morning and at bedtime.     . citalopram (CELEXA) 20 MG tablet Take 20 mg by mouth daily.    . eszopiclone (LUNESTA) 1  MG TABS tablet Take 1 mg by mouth at bedtime as needed for sleep. Take immediately before bedtime    . ferrous sulfate 325 (65 FE) MG tablet Take 325 mg by mouth daily with breakfast.    . metoCLOPramide (REGLAN) 10 MG tablet Take 10 mg by mouth 4 (four) times daily as needed.    . ondansetron (ZOFRAN) 4 MG tablet Take 4 mg by mouth 3 (three) times daily as needed.    . pantoprazole (PROTONIX) 40 MG tablet Take 40 mg by mouth daily.    . Prenatal MV-Min-FA-Omega-3  (PRENATAL GUMMIES/DHA & FA) 0.4-32.5 MG CHEW Chew by mouth.       Allergies  Allergen Reactions  . Tramadol Nausea And Vomiting    History of present pregnancy: Pt Info/Preference:  Screening/Consents:  Labs:   EDD: Estimated Date of Delivery: 09/11/19  Establised: Patient's last menstrual period was 12/05/2018.  Anatomy Scan: Date: 07/01/2019 Placenta Location: posterior Genetic Screen: Panoroma:Low risk  AFP:  First Tri: Quad:  Office: [facility]            First PNV: 15.6 wg Blood Type  B+  Language: englsih Last PNV: 38.5 wg Rhogam  N/A  Flu Vaccine:  utd   Antibody  Neg  TDaP vaccine utd   GTT: Early: 5.9 Third Trimester: 92  Feeding Plan: bottle BTL: no Rubella:  Immune  Contraception: ??? VBAC: no RPR:   NR  Circumcision: N/A   HBsAg:  Neg  Pediatrician:  ???   HIV:   Neg  Prenatal Classes: no Additional [place]: Yes growth see below GBS:  Negative(For PCN allergy, check sensitivities)       Chlamydia: neg    MFM Referral/Consult:  GC: neg  Support Person: partner   PAP: ???  Pain Management: epidural Neonatologist Referral:  Hgb Electrophoresis:  AA  Birth Plan: none   Hgb NOB: 11    28W: 9.9  Growth 7/8:  OB History    Gravida  3   Para  1   Term  1   Preterm      AB  1   Living  1     SAB      TAB  1   Ectopic      Multiple      Live Births  1          Past Medical History:  Diagnosis Date  . Anxiety   . Depression   . Fibroid   . Hidradenitis suppurativa   . History of narcotic addiction ([place])   . Suboxone maintenance treatment complicating pregnancy, antepartum ([place])   . Tachycardia    "bouts of tachycardia" up to 130-140s, sometimes at rest but usually with anxiety  . Vaginal Pap smear, abnormal    Past Surgical History:  Procedure Laterality Date  . HYDRADENITIS EXCISION     bilateral axilla  . HYDRADENITIS EXCISION Left 09/12/2017   Procedure: EXCISION OF LEFT INNER THIGH HIDRADENITIS;  Surgeon: Clovis Riley, MD;  Location: [facility];   Service: General;  Laterality: Left;  . WISDOM TOOTH EXTRACTION     Family History: family history includes Cancer in her mother; Diabetes in her mother; Gout in her father; Hypertension in her father. Social History:  reports that she has never smoked. She has never used smokeless tobacco. She reports current alcohol use. She reports current drug use. Drug: Other-see comments.   Prenatal Transfer Tool  Maternal Diabetes: No Genetic Screening: Normal Maternal Ultrasounds/Referrals: Isolated EIF (echogenic intracardiac  focus) Fetal Ultrasounds or other Referrals:  None Maternal Substance Abuse:  No Significant Maternal Medications:  Meds include: Other:   buprenorphine HCl citalopram eszopiclone metoclopramide HCl ondansetron HCl pantoprazole Prenatal Gummies promethazine Significant Maternal Lab Results: Group B Strep negative  ROS:  Review of Systems  Constitutional: Negative.   HENT: Negative.   Eyes: Negative.   Respiratory: Negative.   Cardiovascular: Negative.   Gastrointestinal: Negative.   Genitourinary: Negative.   Musculoskeletal: Negative.   Skin: Negative.   Neurological: Negative.   Endo/Heme/Allergies: Negative.   Psychiatric/Behavioral: Negative.      Physical Exam: BP 126/68 (BP Location: Right Arm)   Pulse 95   Temp 98.4 F (36.9 C) (Oral)   Resp 18   LMP 12/05/2018   SpO2 97%   Physical Exam HENT:     Head: Normocephalic and atraumatic.     Nose: Nose normal.     Mouth/Throat:     Mouth: Mucous membranes are moist.  Eyes:     Conjunctiva/sclera: Conjunctivae normal.     Pupils: Pupils are equal, round, and reactive to light.  Cardiovascular:     Rate and Rhythm: Normal rate and regular rhythm.     Pulses: Normal pulses.     Heart sounds: Normal heart sounds.  Pulmonary:     Effort: Pulmonary effort is normal.     Breath sounds: Normal breath sounds.  Abdominal:     General: Abdomen is flat. Bowel sounds are normal.     Palpations:  Abdomen is soft.  Genitourinary:    Comments: Deffer[place] Musculoskeletal:        General: Normal range of motion.     Cervical back: Normal range of motion and neck supple.  Skin:    General: Skin is warm and dry.     Capillary Refill: Capillary refill takes less than 2 seconds.  Neurological:     General: No focal deficit present.     Mental Status: She is alert.  Psychiatric:        Mood and Affect: Mood normal.        Behavior: Behavior normal.      NST: FHR baseline 135 bpm, Variability: moderate, Accelerations:present, Decelerations:  Absent= Cat 1/Reactive UC:   irregular, every 10-20 minutes SVE: 0/50/-3   , vertex verified by fetal sutures.  Leopold's: Position vertex, EFW 7lbs via leopold's.   Labs: No results found for this or any previous visit (from the past 24 hour(s)).  Imaging:  No results found.  MAU Course: Orders Placed This Encounter  Procedures  . US Fetal BPP W/O Non Stress  . Diet regular Room service appropriate? Yes; Fluid consistency: Thin  . Notify physician (specify)  . Vital signs  . Defer vaginal exam for vaginal bleeding or PROM <37 weeks  . Initiate Oral Care Protocol  . Initiate Carrier Fluid Protocol  . Place TED hose  . Fetal monitoring  . Continuous tocometry  . Activity as tolerated  . Full code  . Type and screen [place]  . Place in observation (patient's expected length of stay will be less than 2 midnights)   Meds ordered this encounter  Medications  . acetaminophen (TYLENOL) tablet 650 mg  . zolpidem (AMBIEN) tablet 5 mg  . docusate sodium (COLACE) capsule 100 mg  . calcium carbonate (TUMS - dosed in mg elemental calcium) chewable tablet 400 mg of elemental calcium  . prenatal multivitamin tablet 1 tablet    Assessment/Plan: Kristen David is a  39 y.o. female, G3P1011, IUP at 38.5 weeks, presenting for admission to high risk OB for BPP of 6/10 (Off for breathing and tone). Low risk female. Low risk  female. 7/8 [place] EFW 5.15lbs. Pt endorse + Fm. Denies vaginal leakage. Denies vaginal bleeding. Denies feeling cxt's.   Pregnancy Problems advanced maternal age gravida (Low risk Panorama) Anemia (Hgb 9.9 at NOB, Hb 8 now) atypical squamous cells of undetermined significance (ASCUS, positive HPV on pap 03/16/19, plan colpo, may defer until pp) depressive disorder (Citalopram 30 mg q day) hidradenitis suppurativa (Reports she had severe pain from HS that required narcotics to the point of dependence, now on Subutex (buprenorphine). history of substance abuse (Narcotics. On subutex.) infection due to Escherichia coli (On NOB urine, rx'd, test of cure negative) Insomnia (Lunesta, elects to continue due to severe insomnia) left ventricular wall echogenicity (Noted on anatomy US, normal Panorama) Polyhydramnios (On BPP - AFI 26.2, resolved currently 13.7) Thrombocytosis (Platelet 463 on 03/16/19--recheck 04/01/19 436. Recheck at [facility].) uterine leiomyoma (x 2, small (2 cm and 1 cm)  FWB: Cat 1 Fetal Tracing.   Plan: Admit to high risk ob for observation per consult with Dr Alesia Richards Plan to be transferred to LD for induction due to BPP 6/10 per pt request to be induced.  Routine [facility] orders Pain med/epidural prn Substance abuse: continue subutex8/2mg  BID SL, and UTox screen for h/o use disorder.  H/O depression: Continue celexa 20mg  daily.  Cytotec for induction Anticipate labor progression   Noralyn Pick NP-C, CNM, MSN 09/02/2019, 4:20 PM

## 2019-09-02 NOTE — Progress Notes (Signed)
Transferred to birthing suites, room 213 per order via wheelchair.  IV LR infusing without difficulty.  Denies any pain at this time.  EFM removed at 2137 for transport.  Report called to Flossie Buffy, RN.

## 2019-09-02 NOTE — Progress Notes (Signed)
Labor Progress Note  SONIKA LEVINS is a 39 y.o. female, G3P1011, IUP at 38.5 weeks, presenting for admission to high risk OB for BPP of 6/10 (Off for breathing and tone). Low risk female. 7/8 [place] EFW 5.15lbs. Pt then transferred to LD for induction per pt request and due to minimal variability with no acels at times.   Subjective: Pt ate dinner and resting in bed with family at bedside, pt stable, denies question about induction reviewed induction process with Cytotex, Foley, AROM and Pitocin, pt verbalized consent to proceed as needed.  Patient Active Problem List   Diagnosis Date Noted  . Abnormal ultrasonic finding on antenatal screening of mother, antepartum 09/02/2019  . Abnormal prenatal ultrasound 09/02/2019  . Encounter for induction of labor 09/02/2019  . Depression 03/15/2019  . Insomnia 12/09/2018  . Abnormal weight gain 03/14/2018  . Tachycardia 03/14/2018  . Class 2 severe obesity due to excess calories with serious comorbidity and body mass index (BMI) of 39.0 to 39.9 in adult ([place]) 03/14/2018  . Paresthesia of both hands 03/14/2018  . Hidradenitis suppurativa-groin 05/21/2012   Objective: BP (!) 132/80 (BP Location: Right Arm)   Pulse 96   Temp 98.2 F (36.8 C) (Oral)   Resp 18   Ht 5' (1.524 m)   Wt (!) 94.4 kg   LMP 12/05/2018   SpO2 98%   BMI 40.66 kg/m  No intake/output data recorded. No intake/output data recorded. NST: FHR baseline 135 bpm, Variability: minimal , Accelerations:present, Decelerations:  Absent= Cat 2/Non-reactive CTX:  Occ Uterus gravid, soft non tender, moderate to palpate with contractions.  SVE: 0/50/-3   Pitocin at (N/A) mUn/min  Assessment:  ELOISA CHOKSHI is a 39 y.o. female, G3P1011, IUP at 38.5 weeks, presenting for admission to high risk OB for BPP of 6/10 (Off for breathing and tone). Low risk female. 7/8 [place] EFW 5.15lbs. Pt then transferred to LD for induction per pt request and due to minimal variability with no acels at  times.  Patient Active Problem List   Diagnosis Date Noted  . Abnormal ultrasonic finding on antenatal screening of mother, antepartum 09/02/2019  . Abnormal prenatal ultrasound 09/02/2019  . Encounter for induction of labor 09/02/2019  . Depression 03/15/2019  . Insomnia 12/09/2018  . Abnormal weight gain 03/14/2018  . Tachycardia 03/14/2018  . Class 2 severe obesity due to excess calories with serious comorbidity and body mass index (BMI) of 39.0 to 39.9 in adult ([place]) 03/14/2018  . Paresthesia of both hands 03/14/2018  . Hidradenitis suppurativa-groin 05/21/2012   NICHD: Category 2  Category 2 with active intrauterine resuscitative measures fluid bolus and position changes.   Membranes:  Intact, no s/s of infection  Induction:    Cytotec xPlan to start  Foley Bulb: N/A Yet  Pitocin - N/A yet  Pain management:               IV pain management: xPRN             Epidural placement:  PRN  GBS Negative  H.O Substance use: Utox in progress, compliant with subutex 8/2mg  SL BID.   H/O Depression: Mood stable.    Plan: Continue labor plan H/O Substance use: SUbutex 8/2mg  BID SL.  H/O Depression: Continue celexa 20mg  daily.  Continuous monitoring Rest Ambulate Frequent position changes to facilitate fetal rotation and descent. Will reassess with cervical exam prior to placement cytotec  Cytotex for cervical ripening.  Anticipate labor progression and vaginal delivery.   [facility] aware of plan and verbalized agreement.   Noralyn Pick, NP-C, CNM, MSN 09/02/2019. 10:15 PM

## 2019-09-03 ENCOUNTER — Encounter (HOSPITAL_COMMUNITY): Payer: Self-pay | Admitting: Obstetrics & Gynecology

## 2019-09-03 ENCOUNTER — Inpatient Hospital Stay (HOSPITAL_COMMUNITY): Payer: No Typology Code available for payment source | Admitting: Anesthesiology

## 2019-09-03 LAB — RAPID URINE DRUG SCREEN, HOSP PERFORMED
Amphetamines: NOT DETECTED
Barbiturates: NOT DETECTED
Benzodiazepines: NOT DETECTED
Cocaine: NOT DETECTED
Opiates: NOT DETECTED
Tetrahydrocannabinol: NOT DETECTED

## 2019-09-03 LAB — RPR: RPR Ser Ql: NONREACTIVE

## 2019-09-03 MED ORDER — EPHEDRINE 5 MG/ML INJ: 10.0000 mg | INTRAVENOUS | Status: DC | PRN

## 2019-09-03 MED ORDER — PHENYLEPHRINE 40 MCG/ML (10ML) SYRINGE FOR IV PUSH (FOR BLOOD PRESSURE SUPPORT): 80.0000 ug | PREFILLED_SYRINGE | INTRAVENOUS | Status: DC | PRN

## 2019-09-03 MED ORDER — OXYTOCIN-SODIUM CHLORIDE 30-0.9 UT/500ML-% IV SOLN
1.0000 m[IU]/min | INTRAVENOUS | Status: DC
  Administered 2019-09-03: 1 m[IU]/min via INTRAVENOUS
  Filled 2019-09-03: qty 500

## 2019-09-03 MED ORDER — FENTANYL-BUPIVACAINE-NACL 0.5-0.125-0.9 MG/250ML-% EP SOLN
12.0000 mL/h | EPIDURAL | Status: DC | PRN
  Filled 2019-09-03: qty 250

## 2019-09-03 MED ORDER — SODIUM CHLORIDE (PF) 0.9 % IJ SOLN
INTRAMUSCULAR | Status: DC | PRN
  Administered 2019-09-03: 12 mL/h via EPIDURAL

## 2019-09-03 MED ORDER — BUPRENORPHINE HCL 8 MG SL SUBL
8.0000 mg | SUBLINGUAL_TABLET | Freq: Two times a day (BID) | SUBLINGUAL | Status: DC
  Administered 2019-09-03 – 2019-09-05 (×4): 8 mg via SUBLINGUAL
  Filled 2019-09-03 (×4): qty 1

## 2019-09-03 MED ORDER — LACTATED RINGERS IV SOLN: 500.0000 mL | Freq: Once | INTRAVENOUS | Status: DC

## 2019-09-03 MED ORDER — TERBUTALINE SULFATE 1 MG/ML IJ SOLN: 0.2500 mg | Freq: Once | INTRAMUSCULAR | Status: DC | PRN

## 2019-09-03 MED ORDER — LIDOCAINE HCL (PF) 1 % IJ SOLN
INTRAMUSCULAR | Status: DC | PRN
  Administered 2019-09-03: 4 mL via EPIDURAL
  Administered 2019-09-03: 6 mL via EPIDURAL

## 2019-09-03 MED ORDER — DIPHENHYDRAMINE HCL 50 MG/ML IJ SOLN: 12.5000 mg | INTRAMUSCULAR | Status: DC | PRN

## 2019-09-03 NOTE — Anesthesia Procedure Notes (Signed)
Epidural Patient location during procedure: OB Start time: 09/03/2019 12:05 PM End time: 09/03/2019 12:18 PM  Staffing Anesthesiologist: Lidia Collum, MD Performed: anesthesiologist   Preanesthetic Checklist Completed: patient identified, IV checked, risks and benefits discussed, monitors and equipment checked, pre-op evaluation and timeout performed  Epidural Patient position: sitting Prep: DuraPrep Patient monitoring: heart rate, continuous pulse ox and blood pressure Approach: midline Location: L3-L4 Injection technique: LOR air  Needle:  Needle type: Tuohy  Needle gauge: 17 G Needle length: 9 cm Needle insertion depth: 7 cm Catheter type: closed end flexible Catheter size: 19 Gauge Catheter at skin depth: 12 cm Test dose: negative  Assessment Events: blood not aspirated, injection not painful, no injection resistance, no paresthesia and negative IV test  Additional Notes Reason for block:procedure for pain

## 2019-09-03 NOTE — Anesthesia Preprocedure Evaluation (Signed)
Anesthesia Evaluation  Patient identified by MRN, date of birth, ID band Patient awake    Reviewed: Allergy & Precautions, H&P , NPO status , Patient's Chart, lab work & pertinent test results  History of Anesthesia Complications Negative for: history of anesthetic complications  Airway Mallampati: II  TM Distance: >3 FB Neck ROM: full    Dental no notable dental hx.    Pulmonary neg pulmonary ROS,    Pulmonary exam normal        Cardiovascular negative cardio ROS Normal cardiovascular exam Rhythm:regular Rate:Normal     Neuro/Psych Anxiety Depression negative neurological ROS     GI/Hepatic negative GI ROS, (+)     substance abuse (on suboxone)  ,   Endo/Other  Morbid obesity  Renal/GU      Musculoskeletal  (+) narcotic dependent  Abdominal   Peds  Hematology  (+) Blood dyscrasia, anemia ,   Anesthesia Other Findings   Reproductive/Obstetrics (+) Pregnancy                             Anesthesia Physical Anesthesia Plan  ASA: III  Anesthesia Plan: Epidural   Post-op Pain Management:    Induction:   PONV Risk Score and Plan:   Airway Management Planned:   Additional Equipment:   Intra-op Plan:   Post-operative Plan:   Informed Consent: I have reviewed the patients History and Physical, chart, labs and discussed the procedure including the risks, benefits and alternatives for the proposed anesthesia with the patient or authorized representative who has indicated his/her understanding and acceptance.       Plan Discussed with:   Anesthesia Plan Comments:         Anesthesia Quick Evaluation

## 2019-09-03 NOTE — Progress Notes (Signed)
Pt foley removed without difficulty. Urine blood tinged with 732ml in foley bag

## 2019-09-03 NOTE — Progress Notes (Signed)
Kristen David is a 39 y.o. G3P1011 at [redacted]w[redacted]d IOL for BPP 6/10, spans of minimal variability, subutex use cytotec given x 2 doses, coping well Pitocin infusing   Subjective: Comfortable with epidural  Objective: BP (!) 132/79   Pulse 70   Temp 98.8 F (37.1 C) (Oral)   Resp 18   Ht 5' (1.524 m)   Wt (!) 94.4 kg   LMP 12/05/2018   SpO2 96%   BMI 40.66 kg/m   FHT:   UC:   irregular, every 1-3 minutes SVE:   Dilation: 4 Effacement (%): 60, 70 Station: 0 Exam by:: Earl Losee CNM AROM at 1700, FSE placed Labs: Lab Results  Component Value Date   WBC 6.9 09/02/2019   HGB 9.7 (L) 09/02/2019   HCT 31.9 (L) 09/02/2019   MCV 86.7 09/02/2019   PLT 326 09/02/2019    Assessment / Plan: IOL progressing well   Labor: Comfortable with epidural. AROM complete. Continue pitocin per protocol. Preeclampsia:  no signs or symptoms of toxicity Fetal Wellbeing:  Category I Pain Control: epidural in place I/D:  n/a Anticipated MOD:  NSVD  Brinkley Peet B Kymir Coles 09/03/2019, 5:29 PM

## 2019-09-03 NOTE — Progress Notes (Signed)
Kristen David is a 39 y.o. G3P1011 at [redacted]w[redacted]d IOL for BPP 6/10, spans of minimal variability, subutex use cytotec given x 2 doses, coping well Pitocin infusing   Subjective: Comfortable with epidural  Objective: BP (!) 132/78   Pulse 75   Temp 98.8 F (37.1 C) (Oral)   Resp 18   Ht 5' (1.524 m)   Wt (!) 94.4 kg   LMP 12/05/2018   SpO2 96%   BMI 40.66 kg/m   FHT:  FHR: 150 bpm, variability: moderate,  accelerations:  Abscent,  decelerations:  Absent UC:   irregular, every 1-3 minutes SVE:   Dilation: 3 Effacement (%): 60 Station: 0 Exam by:: Kristen David RNC  Labs: Lab Results  Component Value Date   WBC 6.9 09/02/2019   HGB 9.7 (L) 09/02/2019   HCT 31.9 (L) 09/02/2019   MCV 86.7 09/02/2019   PLT 326 09/02/2019    Assessment / Plan: IOL progressing well   Labor: Comfortable with epidural. Plan AROM on next exam Preeclampsia:  no signs or symptoms of toxicity Fetal Wellbeing:  Category I Pain Control: epidural in place I/D:  n/a Anticipated MOD:  NSVD  Kristen David 09/03/2019, 3:28 PM

## 2019-09-03 NOTE — Progress Notes (Signed)
Labor Progress Note  Kristen David is a 39 y.o. female, G3P1011, IUP at 38.5 weeks, presenting for admission to high risk OB for BPP of 6/10 (Off for breathing and tone). Low risk female. 7/8 [place] EFW 5.15lbs. Pt then transferred to LD for induction per pt request and due to minimal variability with no acels at times.   Subjective: Pt resting in bed in NAD.  Patient Active Problem List   Diagnosis Date Noted  . Abnormal ultrasonic finding on antenatal screening of mother, antepartum 09/02/2019  . Abnormal prenatal ultrasound 09/02/2019  . Encounter for induction of labor 09/02/2019  . Depression 03/15/2019  . Insomnia 12/09/2018  . Abnormal weight gain 03/14/2018  . Tachycardia 03/14/2018  . Class 2 severe obesity due to excess calories with serious comorbidity and body mass index (BMI) of 39.0 to 39.9 in adult ([place]) 03/14/2018  . Paresthesia of both hands 03/14/2018  . Hidradenitis suppurativa-groin 05/21/2012   Objective: BP (!) 140/86   Pulse 82   Temp 98.6 F (37 C) (Oral)   Resp 18   Ht 5' (1.524 m)   Wt (!) 94.4 kg   LMP 12/05/2018   SpO2 98%   BMI 40.66 kg/m  No intake/output data recorded. No intake/output data recorded. NST: FHR baseline 140 bpm, Variability: minimal , Accelerations:present, Decelerations:  Absent= Cat 1-2/switch from minimal to moderate variability. Fluid bolus and position changed performed. Reactive CTX:  Occ Uterus gravid, soft non tender, moderate to palpate with contractions.  SVE: 0/50/-3 Dilation: 1 Effacement (%): 50 Station: -3 Exam by:: Flossie Buffy, RN Pitocin at (N/A) mUn/min  Assessment:  Kristen David is a 39 y.o. female, G3P1011, IUP at 38.5 weeks, presenting for admission to high risk OB for BPP of 6/10 (Off for breathing and tone). Low risk female. 7/8 [place] EFW 5.15lbs. Pt then transferred to LD for induction per pt request and due to minimal variability with no acels at times. Progressing with cervical ripening.  Patient  Active Problem List   Diagnosis Date Noted  . Abnormal ultrasonic finding on antenatal screening of mother, antepartum 09/02/2019  . Abnormal prenatal ultrasound 09/02/2019  . Encounter for induction of labor 09/02/2019  . Depression 03/15/2019  . Insomnia 12/09/2018  . Abnormal weight gain 03/14/2018  . Tachycardia 03/14/2018  . Class 2 severe obesity due to excess calories with serious comorbidity and body mass index (BMI) of 39.0 to 39.9 in adult ([place]) 03/14/2018  . Paresthesia of both hands 03/14/2018  . Hidradenitis suppurativa-groin 05/21/2012   NICHD: Category 1-2  Category 2 with active intrauterine resuscitative measures fluid bolus and position changes, switching between min to mod variability.   Membranes:  Intact, no s/s of infection  Induction:    Cytotec x7/29 @ 2325  Foley Bulb: N/A Yet  Pitocin - N/A yet  Pain management:               IV pain management: x1 dose fentanyl @ 6269, then D/C due to h/o opioid dependence.              Epidural placement:  PRN  GBS Negative  H.O Substance use: Utox in progress, compliant with subutex 8/2mg  SL BID.   H/O Depression: Mood stable.    Plan: Continue labor plan H/O Substance use: SUbutex 8/2mg  BID SL. DO not prescribe opioid PP. If pt in pain plan to get epidural.  H/O Depression: Continue celexa 20mg  daily.  Continuous monitoring Rest Ambulate Frequent position changes to facilitate fetal rotation  and descent. Will reassess with cervical exam prior to placement cytotec  Cytotex for cervical ripening.  Anticipate labor progression and vaginal delivery.    Noralyn Pick, NP-C, CNM, MSN 09/03/2019. 6:37 AM

## 2019-09-03 NOTE — Progress Notes (Signed)
Labor Progress Note  Kristen David is a 39 y.o. female, G3P1011, IUP at 38.5 weeks, presenting for admission to high risk OB for BPP of 6/10 (Off for breathing and tone). Low risk female. 7/8 [place] EFW 5.15lbs. Pt then transferred to LD for induction per pt request and due to minimal variability with no acels at times.   Subjective: Pt sleeping well.  Patient Active Problem List   Diagnosis Date Noted  . Abnormal ultrasonic finding on antenatal screening of mother, antepartum 09/02/2019  . Abnormal prenatal ultrasound 09/02/2019  . Encounter for induction of labor 09/02/2019  . Depression 03/15/2019  . Insomnia 12/09/2018  . Abnormal weight gain 03/14/2018  . Tachycardia 03/14/2018  . Class 2 severe obesity due to excess calories with serious comorbidity and body mass index (BMI) of 39.0 to 39.9 in adult ([place]) 03/14/2018  . Paresthesia of both hands 03/14/2018  . Hidradenitis suppurativa-groin 05/21/2012   Objective: BP (!) 111/61   Pulse 83   Temp 98.6 F (37 C) (Oral)   Resp 18   Ht 5' (1.524 m)   Wt (!) 94.4 kg   LMP 12/05/2018   SpO2 98%   BMI 40.66 kg/m  No intake/output data recorded. No intake/output data recorded. NST: FHR baseline 140 bpm, Variability: minimal , Accelerations:present, Decelerations:  Absent= Cat 1-2/switch from minimal to moderate variability. Fluid bolus and position changed performed. Reactive CTX:  Occ Uterus gravid, soft non tender, moderate to palpate with contractions.  SVE: 0/50/-3 Dilation: 1 Effacement (%): 50 Station: -3 Exam by:: Flossie Buffy, RN Pitocin at (N/A) mUn/min  Assessment:  Kristen David is a 39 y.o. female, G3P1011, IUP at 38.5 weeks, presenting for admission to high risk OB for BPP of 6/10 (Off for breathing and tone). Low risk female. 7/8 [place] EFW 5.15lbs. Pt then transferred to LD for induction per pt request and due to minimal variability with no acels at times. Progressing with cervical ripening.  Patient Active  Problem List   Diagnosis Date Noted  . Abnormal ultrasonic finding on antenatal screening of mother, antepartum 09/02/2019  . Abnormal prenatal ultrasound 09/02/2019  . Encounter for induction of labor 09/02/2019  . Depression 03/15/2019  . Insomnia 12/09/2018  . Abnormal weight gain 03/14/2018  . Tachycardia 03/14/2018  . Class 2 severe obesity due to excess calories with serious comorbidity and body mass index (BMI) of 39.0 to 39.9 in adult ([place]) 03/14/2018  . Paresthesia of both hands 03/14/2018  . Hidradenitis suppurativa-groin 05/21/2012   NICHD: Category 1-2  Category 2 with active intrauterine resuscitative measures fluid bolus and position changes, switching between min to mod variability.   Membranes:  Intact, no s/s of infection  Induction:    Cytotec x7/29 @ 2325  Foley Bulb: N/A Yet  Pitocin - N/A yet  Pain management:               IV pain management: xPRN             Epidural placement:  PRN  GBS Negative  H.O Substance use: Utox in progress, compliant with subutex 8/2mg  SL BID.   H/O Depression: Mood stable.    Plan: Continue labor plan H/O Substance use: SUbutex 8/2mg  BID SL.  H/O Depression: Continue celexa 20mg  daily.  Continuous monitoring Rest Ambulate Frequent position changes to facilitate fetal rotation and descent. Will reassess with cervical exam prior to placement cytotec  Cytotex for cervical ripening.  Anticipate labor progression and vaginal delivery.    [facility],  NP-C, CNM, MSN 09/03/2019. 6:12 AM

## 2019-09-03 NOTE — Progress Notes (Signed)
Kristen David is a 39 y.o. G3P1011 at [redacted]w[redacted]d IOL for BPP 6/10, spans of minimal variability, subutex use cytotec given x 2 doses, coping well  Subjective: Mild cramping Desires breakfast  Objective: BP (!) 131/80   Pulse 93   Temp 98.8 F (37.1 C) (Oral)   Resp 18   Ht 5' (1.524 m)   Wt (!) 94.4 kg   LMP 12/05/2018   SpO2 98%   BMI 40.66 kg/m   FHT:  FHR: 150 bpm, variability: moderate,  accelerations:  Abscent,  decelerations:  Absent UC:   irregular, every 1-3 minutes SVE:   Dilation: 1.5 Effacement (%): Thick Station: -3 Exam by:: [employer] CNM  Labs: Lab Results  Component Value Date   WBC 6.9 09/02/2019   HGB 9.7 (L) 09/02/2019   HCT 31.9 (L) 09/02/2019   MCV 86.7 09/02/2019   PLT 326 09/02/2019    Assessment / Plan: IOL progressing well   Labor: PT desires breakfast and shower. RN to call when back on monitor for evaluation of CTX to see if we will proceed with cytotec or pitocin Preeclampsia:  no signs or symptoms of toxicity Fetal Wellbeing:  Category I Pain Control:  Labor support without medications I/D:  n/a Anticipated MOD:  NSVD  Jessa Stinson B [place] 09/03/2019, 10:20 AM

## 2019-09-04 DIAGNOSIS — O9902 Anemia complicating childbirth: Secondary | ICD-10-CM

## 2019-09-04 LAB — DRUG PROFILE, UR, 9 DRUGS (LABCORP)
Amphetamines, Urine: NEGATIVE ng/mL
Barbiturate, Ur: NEGATIVE ng/mL
Benzodiazepine Quant, Ur: NEGATIVE ng/mL
Cannabinoid Quant, Ur: NEGATIVE ng/mL
Cocaine (Metab.): NEGATIVE ng/mL
Methadone Screen, Urine: NEGATIVE ng/mL
Opiate Quant, Ur: NEGATIVE ng/mL
Phencyclidine, Ur: NEGATIVE ng/mL
Propoxyphene, Urine: NEGATIVE ng/mL

## 2019-09-04 LAB — CBC
HCT: 28.6 % — ABNORMAL LOW (ref 36.0–46.0)
Hemoglobin: 8.8 g/dL — ABNORMAL LOW (ref 12.0–15.0)
MCH: 26.6 pg (ref 26.0–34.0)
MCHC: 30.8 g/dL (ref 30.0–36.0)
MCV: 86.4 fL (ref 80.0–100.0)
Platelets: 306 10*3/uL (ref 150–400)
RBC: 3.31 MIL/uL — ABNORMAL LOW (ref 3.87–5.11)
RDW: 23.5 % — ABNORMAL HIGH (ref 11.5–15.5)
WBC: 13.1 10*3/uL — ABNORMAL HIGH (ref 4.0–10.5)
nRBC: 0 % (ref 0.0–0.2)

## 2019-09-04 MED ORDER — IBUPROFEN 600 MG PO TABS
600.0000 mg | ORAL_TABLET | Freq: Four times a day (QID) | ORAL | Status: DC
  Administered 2019-09-04 – 2019-09-05 (×7): 600 mg via ORAL
  Filled 2019-09-04 (×8): qty 1

## 2019-09-04 MED ORDER — BENZOCAINE-MENTHOL 20-0.5 % EX AERO
1.0000 "application " | INHALATION_SPRAY | CUTANEOUS | Status: DC | PRN
  Administered 2019-09-04: 1 via TOPICAL
  Filled 2019-09-04: qty 56

## 2019-09-04 MED ORDER — ZOLPIDEM TARTRATE 5 MG PO TABS: 5.0000 mg | ORAL_TABLET | Freq: Every evening | ORAL | Status: DC | PRN

## 2019-09-04 MED ORDER — COCONUT OIL OIL: 1.0000 "application " | TOPICAL_OIL | Status: DC | PRN

## 2019-09-04 MED ORDER — DIPHENHYDRAMINE HCL 25 MG PO CAPS: 25.0000 mg | ORAL_CAPSULE | Freq: Four times a day (QID) | ORAL | Status: DC | PRN

## 2019-09-04 MED ORDER — ONDANSETRON HCL 4 MG PO TABS: 4.0000 mg | ORAL_TABLET | ORAL | Status: DC | PRN

## 2019-09-04 MED ORDER — ACETAMINOPHEN 325 MG PO TABS
650.0000 mg | ORAL_TABLET | ORAL | Status: DC | PRN
  Administered 2019-09-04 – 2019-09-05 (×4): 650 mg via ORAL
  Filled 2019-09-04 (×4): qty 2

## 2019-09-04 MED ORDER — DIBUCAINE (PERIANAL) 1 % EX OINT: 1.0000 "application " | TOPICAL_OINTMENT | CUTANEOUS | Status: DC | PRN

## 2019-09-04 MED ORDER — SENNOSIDES-DOCUSATE SODIUM 8.6-50 MG PO TABS
2.0000 | ORAL_TABLET | ORAL | Status: DC
  Administered 2019-09-04 (×2): 2 via ORAL
  Filled 2019-09-04 (×2): qty 2

## 2019-09-04 MED ORDER — ONDANSETRON HCL 4 MG/2ML IJ SOLN: 4.0000 mg | INTRAMUSCULAR | Status: DC | PRN

## 2019-09-04 MED ORDER — MAGNESIUM OXIDE 400 (241.3 MG) MG PO TABS
400.0000 mg | ORAL_TABLET | Freq: Every day | ORAL | Status: DC
  Administered 2019-09-04 – 2019-09-05 (×2): 400 mg via ORAL
  Filled 2019-09-04 (×2): qty 1

## 2019-09-04 MED ORDER — PRENATAL MULTIVITAMIN CH
1.0000 | ORAL_TABLET | Freq: Every day | ORAL | Status: DC
  Administered 2019-09-04 – 2019-09-05 (×2): 1 via ORAL
  Filled 2019-09-04 (×2): qty 1

## 2019-09-04 MED ORDER — WITCH HAZEL-GLYCERIN EX PADS: 1.0000 "application " | MEDICATED_PAD | CUTANEOUS | Status: DC | PRN

## 2019-09-04 MED ORDER — POLYSACCHARIDE IRON COMPLEX 150 MG PO CAPS
150.0000 mg | ORAL_CAPSULE | Freq: Every day | ORAL | Status: DC
  Administered 2019-09-04 – 2019-09-05 (×2): 150 mg via ORAL
  Filled 2019-09-04 (×2): qty 1

## 2019-09-04 MED ORDER — TETANUS-DIPHTH-ACELL PERTUSSIS 5-2.5-18.5 LF-MCG/0.5 IM SUSP: 0.5000 mL | Freq: Once | INTRAMUSCULAR | Status: DC

## 2019-09-04 MED ORDER — SIMETHICONE 80 MG PO CHEW
80.0000 mg | CHEWABLE_TABLET | ORAL | Status: DC | PRN
  Administered 2019-09-04: 80 mg via ORAL

## 2019-09-04 NOTE — Clinical Social Work Maternal (Addendum)
CLINICAL SOCIAL WORK MATERNAL/CHILD NOTE  Patient Details  Name: Kristen David MRN: [id number] Date of Birth: 08/10/1980  Date:  09/04/2019  Clinical Social Worker Initiating Note:  Kristen David, MSW, LCSW Date/Time: Initiated:  09/04/19/1500     Child's Name:  Kristen David   Biological Parents:  Mother, Father Kristen David, 11/08/1973, [phone])   Need for Interpreter:  None   Reason for Referral:  Current Substance Use/Substance Use During Pregnancy    Address:  [address] [place] [address]    Phone number:  [phone] (home)     Additional phone number: none stated  Household Members/Support Persons (HM/SP):   Household Member/Support Person 1   HM/SP Name Relationship DOB or Age  HM/SP -1 Kristen David son 12/08/2000  HM/SP -2        HM/SP -3        HM/SP -4        HM/SP -5        HM/SP -6        HM/SP -7        HM/SP -8          Natural Supports (not living in the home):  Parent, Friends   Professional Supports: Therapist [facility], [phone], Rotate therapist and psyc.)   Employment: Full-time   Type of Work: Equities trader   Education:  Forensic psychologist   Homebound arranged:    Museum/gallery curator Resources:  Multimedia programmer   Other Resources:      Cultural/Religious Considerations Which May Impact Care:  none stated  Strengths:  Ability to meet basic needs , Compliance with medical plan , Home prepared for child , Understanding of illness, Psychotropic Medications, Pediatrician chosen   Psychotropic Medications:  Subutex, Celexa      Pediatrician:    [place]  Pediatrician List:   [facility] (Peds)  [place]      Pediatrician Fax Number:    Risk Factors/Current Problems:  None   Cognitive State:  Able to Concentrate , Alert , Linear Thinking    Mood/Affect:  Interested ,  Comfortable , Calm , Relaxed    CSW Assessment: CSW received consult for infant exposed to Subutex.  CSW met with MOB at bedside to offer support and complete assessment. On arrival, CSW introduced self and stated reason for visit. MGM and MOB's friend, Kristen David were present, however, after PPD/A and SIDS education, both stepped out to offer MOB privacy during assessment. MOB, MGM, and Kristen David were pleasant and engaged during visit.    CSW provided education regarding the baby blues period vs. perinatal mood disorders, discussed treatment and gave resources for mental health follow up if concerns arise.  CSW recommends self-evaluation during the postpartum time period using the New Mom Checklist from Postpartum Progress and encouraged MOB, Kristen David, and MGM to contact a medical professional if symptoms are noted at any time. All agreed, stated understanding, and denied any questions.    CSW provided review of Sudden Infant Death Syndrome (SIDS) precautions. MOB, Kristen David, and MGM stated understanding and denied any questions. MOB confirmed having all needed items for baby including car seat and bassinet for baby's safe sleep.    During assessment, MOB confirmed hx of anxiety, depression, insomnia, opioid dependence, and Subutex treatment. MOB reported anxiety and depression have been well managed with Rx Celexa, insomnia with Lunesta,  and opioid addition with Subutex. MOB identified anxiety and depression sx as isolation, lack of interest in daily activities and insomnia. MOB denied any other BH dx, SI, HI, or domestic violence, and identified coping skills as frequent communication with mom and best friend Kristen David. MOB reported participation in M.A.T. program for one and a half years, via [facility] in [place]. Withing the practice MOB frequently sees rotation of therapist for individual and group counseling. As well, MOB sees rotation of medical providers for Subutex treatment.  MOB expressed treatment  has been successful. MOB reported drug of choice before treatment as oxycodone. MOB reports last use as two years ago. MOB identified mom, best friend, dad, brother, SIL, family, and friends as support. CSW celebrated MOB's efforts and sobriety.  CSW informed MOB of hospital's infant drug screen policy, infant's negative UDS results, pending CDS results, and warranted CPS report. MOB stated understanding and denied any questions. MOB declined additional resources and support. MOB stated she feels well supported by [facility], family, and friends.   CSW Plan/Description: No Further Intervention Required/No Barriers to Discharge, Sudden Infant Death Syndrome (SIDS) Education, Perinatal Mood and Anxiety Disorder (PMADs) Education, Kristen David, Child Protective Service Report , CSW Will Continue to Monitor Umbilical Cord Tissue Drug Screen Results and Make Report if Warranted   CSW contacted [facility] CPS to completed report. CSW completed report with CPS intake worker, Kristen David.  Later, CSW received a call from [facility] to confirm report was screened out. Kristen David encouraged CSW to call back and complete another report if CDS returns positive and/or additioanal concerns arise. CSW stated understanding and appreciation.  Kristen David D. Kristen David, MSW, LCSW Clinical Social Worker [phone] 09/04/2019, 4:06 PM

## 2019-09-04 NOTE — Lactation Note (Addendum)
This note was copied from a baby's chart. Lactation Consultation Note  Patient Name: Kristen David [id number] Date: 09/04/2019   Lactation visit attempted at 17 hrs of life, but the room was dark & Mom was resting. Lactation brochure was placed in room. Lactation to f/u later.   Mom's feeding choice is breast/formula. Mom is noted to be on Subutex & citalopram (L2). I asked Colletta Maryland, RN if Mom is interested in pumping since she is on Subutex. RN said she will discuss with Mom.   Mom had a PPH with vag delivery.  Matthias Hughs [facility] 09/04/2019, 2:42 PM

## 2019-09-04 NOTE — Progress Notes (Signed)
PPD # 1 S/P NSVD  Live born female  Birth Weight: 7 lb 4.1 oz (3290 g) APGAR: 9, 9  Newborn Delivery   Birth date/time: 09/03/2019 21:29:00 Delivery type: Vaginal, Spontaneous     Baby name: Londyn Delivering provider: Starla Link  Episiotomy:None   Lacerations:2nd degree;Vaginal   Feeding: breast  Pain control at delivery: Epidural   S:  Reports feeling good. Has some cramping but it is adequately relieved with ibuprofen. States she is frustrated with having to stay with the baby for 5 days due to Subutex use in pregnancy. Verbalizes understanding of importance to monitor baby.              Tolerating po/ No nausea or vomiting             Bleeding is light, moderate             Pain controlled with ibuprofen (OTC)             Up ad lib / ambulatory / voiding without difficulties   O:  A & O x 3, in no apparent distress              VS:  Vitals:   09/04/19 0036 09/04/19 0125 09/04/19 0526 09/04/19 0852  BP: (!) 134/86 124/77 (!) 114/63 (!) 129/74  Pulse: 95 98 78 82  Resp: 17 16 16 20   Temp: 98.2 F (36.8 C) 98 F (36.7 C) 98.2 F (36.8 C) 99 F (37.2 C)  TempSrc: Oral Oral Oral Oral  SpO2: 100% 100% 98% 99%  Weight:      Height:        LABS:  Recent Labs    09/02/19 1755 09/04/19 0459  WBC 6.9 13.1*  HGB 9.7* 8.8*  HCT 31.9* 28.6*  PLT 326 306    Blood type: --/--/B POS Performed at [facility] [address], [place], [place] [address]  [phone]/29 1830)  Rubella: Immune (02/09 0000)   I&O: I/O last 3 completed shifts: In: 3473.2 [I.V.:3473.2] Out: 2010 [EQAST:4196; Blood:835]          No intake/output data recorded.   Gen: AAO x 3, NAD  Abdomen: soft, non-tender, non-distended             Fundus: firm, non-tender, U-1  Perineum: healing, repair intact, no edema  Lochia: light to moderate  Extremities: no edema, no calf pain or tenderness   A/P: PPD # 1 39 y.o., [id number]   Principal Problem:   Postpartum care following vaginal  delivery 7/30 Active Problems:   Abnormal ultrasonic finding on antenatal screening of mother, antepartum   Abnormal prenatal ultrasound   Encounter for induction of labor   SVD (spontaneous vaginal delivery)   Maternal anemia, with delivery   Doing well - stable status             Will start Niferex and mag oxide for maternal anemia, pt is asymptomatic             Breastfeeding support prn  Routine post partum orders  Anticipate discharge tomorrow for mother, will stay as a boarder until infant is cleared by peds for discharge, estimated length of stay for infant is 5 days   Suzan Nailer, MSN, CNM 09/04/2019, 12:08 PM

## 2019-09-04 NOTE — Anesthesia Postprocedure Evaluation (Signed)
Anesthesia Post Note  Patient: Kristen David  Procedure(s) Performed: AN AD HOC LABOR EPIDURAL     Patient location during evaluation: Mother Baby Anesthesia Type: Epidural Level of consciousness: awake and alert Pain management: pain level controlled Vital Signs Assessment: post-procedure vital signs reviewed and stable Respiratory status: spontaneous breathing, nonlabored ventilation and respiratory function stable Cardiovascular status: stable Postop Assessment: no headache, no backache and epidural receding Anesthetic complications: no   No complications documented.  Last Vitals:  Vitals:   09/04/19 0526 09/04/19 0852  BP: (!) 114/63 (!) 129/74  Pulse: 78 82  Resp: 16 20  Temp: 36.8 C 37.2 C  SpO2: 98% 99%    Last Pain:  Vitals:   09/04/19 0852  TempSrc: Oral  PainSc:    Pain Goal: Patients Stated Pain Goal: 2 (09/04/19 3291)              Epidural/Spinal Function Cutaneous sensation: Normal sensation (09/04/19 0830), Patient able to flex knees: Yes (09/04/19 0830), Patient able to lift hips off bed: Yes (09/04/19 0830), Back pain beyond tenderness at insertion site: No (09/04/19 0830), Progressively worsening motor and/or sensory loss: No (09/04/19 0830), Bowel and/or bladder incontinence post epidural: No (09/04/19 0830)  Rayvon Char

## 2019-09-04 NOTE — Lactation Note (Signed)
This note was copied from a baby's chart. Lactation Consultation Note  Patient Name: Kristen David [id number] Date: 09/04/2019 Reason for consult: Initial assessment Baby 45hrs old, wt loss 0%, term baby. Mom walking around room, maternal grandmother at bedside, baby asleep in bassinet. Mom desires to breast and formula feed baby. Reports baby nursed 1.5hrs ago for 54mins and took 26ml formula. Reports experienced BF difficulty with first baby, large wt loss, low milk supply, breastfed for 2 weeks then switched to formula. Reports goal to BF x85mo with this baby. Discussed frequent feedings at the breast to protect milk supply, will respect mom's decision to breast and formula feed. Mom reports noting an increase in breast size and darkening of areola concerned no colostrum in breasts, this LC unsuccessful at hand expressing, hand pump given, encouraged mom to continue to put baby to breast when feeding cues are shown, call LC to assess latch, mom agreeable with plan. Advised cue based feedings, expect 8-12 feedings in 24hrs, cluster feeding, community resources, [facility] telephone and outpatient support. Employee breast pump given ([employer]). Advised mom to call for [facility] support as needed, otherwise will f/u tomorrow. Mom voiced understanding and with no further concerns. BGilliam, RN, IBCLC  Maternal Data Formula Feeding for Exclusion: Yes Reason for exclusion: Mother's choice to formula and breast feed on admission Has patient been taught Hand Expression?: Yes  Feeding Feeding Type: Breast Fed  LATCH Score                   Interventions Interventions: Breast feeding basics reviewed  Lactation Tools Discussed/Used WIC Program: No   Consult Status Consult Status: Follow-up Date: 09/05/19 Follow-up type: In-patient    Kristen David 09/04/2019, 6:00 PM

## 2019-09-05 MED ORDER — ACETAMINOPHEN 325 MG PO TABS: 650.0000 mg | ORAL_TABLET | ORAL | Status: DC | PRN

## 2019-09-05 MED ORDER — IBUPROFEN 600 MG PO TABS: 600.0000 mg | ORAL_TABLET | Freq: Four times a day (QID) | ORAL | 0 refills | Status: DC

## 2019-09-05 NOTE — Lactation Note (Signed)
This note was copied from a baby's chart. Lactation Consultation Note  Patient Name: Girl Shylyn Younce [id number] Date: 09/05/2019  Baby Girl Jacqulyn Liner now 3 hours old.   Entered room and mom reports they had a rough night.  Per mom she was letting her sleep because of it.  Per mom it has been 4 hours since she has last eaten.  Reminded mom of the eight or more feeds in 24 hours and encouraged mom  to try and feed her soon since if you go 4 hours too many times its hard to get all the feeds in. If infant not cuing and showing signs.  Minimal assist with infant on left breast.  Urged mom to call as needed.  Initiated pumping with DEBP with mom.  Call lactation as needed.   Maternal Data    Feeding    LATCH Score                   Interventions    Lactation Tools Discussed/Used     Consult Status      Eivin Mascio Thompson Caul 09/05/2019, 2:13 PM

## 2019-09-05 NOTE — Discharge Summary (Signed)
Postpartum Discharge Summary  Date of Service updated 09/05/19    Patient Name: Kristen David DOB: 11/30/1980 MRN: [id number]  Date of admission: 09/02/2019 Delivery date:09/03/2019  Delivering provider: PROTHERO, NANCY JEAN  Date of discharge: 09/05/2019  Admitting diagnosis: Abnormal prenatal ultrasound [O28.3] Encounter for induction of labor [Z34.90] Intrauterine pregnancy: [redacted]w[redacted]d     Secondary diagnosis:  Principal Problem:   Postpartum care following vaginal delivery 7/30 Active Problems:   Abnormal ultrasonic finding on antenatal screening of mother, antepartum   Abnormal prenatal ultrasound   Encounter for induction of labor   SVD (spontaneous vaginal delivery)   Maternal anemia, with delivery  Additional problems: none    Discharge diagnosis: Term Pregnancy Delivered                                              Post partum procedures:none Augmentation: AROM, Pitocin and Cytotec Complications: None  Hospital course: Induction of Labor With Vaginal Delivery   39 y.o. yo G3P2012 at [redacted]w[redacted]d was admitted to the hospital 09/02/2019 for induction of labor.  Indication for induction: BPP 6/10.  Patient had an uncomplicated labor course as follows: Membrane Rupture Time/Date: 5:00 PM ,09/03/2019   Delivery Method:Vaginal, Spontaneous  Episiotomy: None  Lacerations:  2nd degree;Vaginal  Details of delivery can be found in separate delivery note.  Patient had a routine postpartum course. Patient is discharged home 09/05/19.  Newborn Data: Birth date:09/03/2019  Birth time:9:29 PM  Gender:Female  Living status:Living  Apgars:9 ,9  Weight:3290 g   Magnesium Sulfate received: No BMZ received: No Rhophylac:N/A MMR:N/A T-DaP:Given prenatally Flu: Yes Transfusion:No  Physical exam  Vitals:   09/04/19 0852 09/04/19 1513 09/04/19 1948 09/05/19 0538  BP: (!) 129/74 125/77 (!) 132/88 (!) 130/80  Pulse: 82 91 83 78  Resp: 20 18 18 18  Temp: 99 F (37.2 C) 98.7 F (37.1 C)  98 F (36.7 C) 98 F (36.7 C)  TempSrc: Oral Oral Oral Oral  SpO2: 99% 100% 100% 100%  Weight:      Height:       General: alert, cooperative and no distress Lochia: appropriate Uterine Fundus: firm Incision: N/A DVT Evaluation: No evidence of DVT seen on physical exam. No cords or calf tenderness. No significant calf/ankle edema. Labs: Lab Results  Component Value Date   WBC 13.1 (H) 09/04/2019   HGB 8.8 (L) 09/04/2019   HCT 28.6 (L) 09/04/2019   MCV 86.4 09/04/2019   PLT 306 09/04/2019   CMP Latest Ref Rng & Units 07/16/2019  Glucose 70 - 99 mg/dL 97  BUN 6 - 20 mg/dL <4(L)  Creatinine 0.44 - 1.00 mg/dL 0.73  Sodium 135 - 145 mmol/L 139  Potassium 3.5 - 5.1 mmol/L 3.7  Chloride 98 - 111 mmol/L 106  CO2 22 - 32 mmol/L 22  Calcium 8.9 - 10.3 mg/dL 8.1(L)  Total Protein 6.5 - 8.1 g/dL 6.7  Total Bilirubin 0.3 - 1.2 mg/dL 0.4  Alkaline Phos 38 - 126 U/L 143(H)  AST 15 - 41 U/L 9(L)  ALT 0 - 44 U/L <6   Edinburgh Score: No flowsheet data found.    After visit meds:  Allergies as of 09/05/2019      Reactions   Tramadol Nausea And Vomiting      Medication List    STOP taking these medications   metoCLOPramide 10 MG tablet Commonly known   as: REGLAN   ondansetron 4 MG tablet Commonly known as: ZOFRAN   pantoprazole 40 MG tablet Commonly known as: PROTONIX     TAKE these medications   acetaminophen 325 MG tablet Commonly known as: Tylenol Take 2 tablets (650 mg total) by mouth every 4 (four) hours as needed (for pain scale < 4).   buprenorphine 8 MG Subl SL tablet Commonly known as: SUBUTEX Place 8 mg under the tongue in the morning and at bedtime.   citalopram 20 MG tablet Commonly known as: CELEXA Take 20 mg by mouth daily.   eszopiclone 1 MG Tabs tablet Commonly known as: LUNESTA Take 1 mg by mouth at bedtime as needed for sleep. Take immediately before bedtime   ferrous sulfate 325 (65 FE) MG tablet Take 325 mg by mouth daily with breakfast.    ibuprofen 600 MG tablet Commonly known as: ADVIL Take 1 tablet (600 mg total) by mouth every 6 (six) hours.   Prenatal Gummies/DHA & FA 0.4-32.5 MG Chew Chew by mouth.        Discharge home in stable condition Infant Feeding: Bottle and Breast Infant Disposition:Baby to room in per Peds due to mom's hx of Subutex.  Discharge instruction: per After Visit Summary and Postpartum booklet. Activity: Advance as tolerated. Pelvic rest for 6 weeks.  Diet: routine diet Anticipated Birth Control: Considering patch or IUD.  Postpartum Appointment:6 weeks Additional Postpartum F/U: 2 week mood eval for hx of depression Future Appointments: Future Appointments  Date Time Provider [place]  11/15/2019  9:30 AM [facility] LAB 6 [facility] None  11/15/2019 10:00 AM Orson Slick, MD [facility] None   Follow up Visit:  [facility]. Schedule an appointment as soon as possible for a visit in 6 week(s).   Specialty: Obstetrics and Gynecology Contact information: [address] [place] [id number] [phone]                  09/05/2019 Arrie Eastern, CNM

## 2019-09-05 NOTE — Discharge Instructions (Signed)

## 2019-09-06 ENCOUNTER — Ambulatory Visit: Payer: Self-pay

## 2019-09-06 MED FILL — IBUPROFEN 600 MG TABLET: 600 | 8 days supply | Qty: 30 | Fill #0

## 2019-09-06 NOTE — Lactation Note (Signed)
This note was copied from a baby's chart. Lactation Consultation Note  Patient Name: Girl Braleigh Massoud [id number] Date: 09/06/2019  Mom holding baby girl Rizzolo on arrival.  Mom reports she just finished feeding her.  Infant currently with pacifier. Mom reports she is still not seeing any milk with pumping but she is starting to see some when she hand expresses.  Reviewed importance of hand expressing and pumping everytime she gives infant a bottle.  Mom reports her mom plans to get her a hands free bra to make it easier on her. Reminded mom this was hopefully temporary.  That her milk would come to volume and baby girl would start breastfeeding better. Also discussed that breast milk could possibly help her be a little less fussy. Discussed Subutex and breastfeeding. Urged mom to call lactation as needed.  Maternal Data    Feeding Feeding Type: Breast Fed  LATCH Score Latch: Grasps breast easily, tongue down, lips flanged, rhythmical sucking.  Audible Swallowing: A few with stimulation  Type of Nipple: Everted at rest and after stimulation  Comfort (Breast/Nipple): Soft / non-tender  Hold (Positioning): Assistance needed to correctly position infant at breast and maintain latch.  LATCH Score: 8  Interventions    Lactation Tools Discussed/Used     Consult Status      Nichelle Renwick Thompson Caul 09/06/2019, 7:28 PM

## 2019-10-12 ENCOUNTER — Ambulatory Visit: Payer: No Typology Code available for payment source | Attending: Internal Medicine

## 2019-10-12 ENCOUNTER — Ambulatory Visit: Payer: Self-pay

## 2019-10-12 DIAGNOSIS — Z23 Encounter for immunization: Secondary | ICD-10-CM

## 2019-10-12 NOTE — Progress Notes (Signed)
   Covid-19 Vaccination Clinic  Name:  Kristen David    MRN: [id number] DOB: 01-Aug-1980  10/12/2019  [name] was observed post Covid-19 immunization for 15 minutes without incident. She was provided with Vaccine Information Sheet and instruction to access the V-Safe system.   [name] was instructed to call 911 with any severe reactions post vaccine: Marland Kitchen Difficulty breathing  . Swelling of face and throat  . A fast heartbeat  . A bad rash all over body  . Dizziness and weakness   Immunizations Administered    Name Date Dose VIS Date Route   Pfizer COVID-19 Vaccine 10/12/2019  2:23 PM 0.3 mL 03/31/2018 Intramuscular   Manufacturer: [place]   Lot: [id number]   [place]: [id number]

## 2019-11-02 ENCOUNTER — Ambulatory Visit: Payer: Self-pay | Attending: Internal Medicine

## 2019-11-02 DIAGNOSIS — Z23 Encounter for immunization: Secondary | ICD-10-CM

## 2019-11-02 NOTE — Progress Notes (Signed)
   Covid-19 Vaccination Clinic  Name:  Kristen David    MRN: [id number] DOB: 1980-10-26  11/02/2019  [name] was observed post Covid-19 immunization for 15 minutes without incident. She was provided with Vaccine Information Sheet and instruction to access the V-Safe system.   [name] was instructed to call 911 with any severe reactions post vaccine: Marland Kitchen Difficulty breathing  . Swelling of face and throat  . A fast heartbeat  . A bad rash all over body  . Dizziness and weakness   Immunizations Administered    Name Date Dose VIS Date Route   Pfizer COVID-19 Vaccine 11/02/2019 11:20 AM 0.3 mL 03/31/2018 Intramuscular   Manufacturer: [place]   Lot: [id number]   [place]: [id number]

## 2019-11-14 ENCOUNTER — Other Ambulatory Visit: Payer: Self-pay | Admitting: Hematology and Oncology

## 2019-11-14 DIAGNOSIS — O99013 Anemia complicating pregnancy, third trimester: Secondary | ICD-10-CM

## 2019-11-14 NOTE — Progress Notes (Signed)
No show

## 2019-11-15 ENCOUNTER — Other Ambulatory Visit: Payer: No Typology Code available for payment source

## 2019-11-15 ENCOUNTER — Encounter: Payer: No Typology Code available for payment source | Admitting: Hematology and Oncology

## 2019-11-15 DIAGNOSIS — O99013 Anemia complicating pregnancy, third trimester: Secondary | ICD-10-CM

## 2019-11-24 MED FILL — CITALOPRAM HBR 20 MG TABLET: 20 | 30 days supply | Qty: 30 | Fill #3

## 2020-01-17 ENCOUNTER — Encounter: Payer: Self-pay | Admitting: Nurse Practitioner

## 2020-01-27 ENCOUNTER — Ambulatory Visit (INDEPENDENT_AMBULATORY_CARE_PROVIDER_SITE_OTHER): Payer: Self-pay | Admitting: Nurse Practitioner

## 2020-01-27 ENCOUNTER — Other Ambulatory Visit: Payer: Self-pay | Admitting: Nurse Practitioner

## 2020-01-27 ENCOUNTER — Ambulatory Visit: Payer: Self-pay | Admitting: Nurse Practitioner

## 2020-01-27 ENCOUNTER — Encounter: Payer: Self-pay | Admitting: Nurse Practitioner

## 2020-01-27 ENCOUNTER — Other Ambulatory Visit: Payer: Self-pay

## 2020-01-27 VITALS — BP 118/80 | HR 75 | Temp 98.7°F | Ht 60.0 in | Wt 196.0 lb

## 2020-01-27 DIAGNOSIS — R5383 Other fatigue: Secondary | ICD-10-CM

## 2020-01-27 DIAGNOSIS — G4700 Insomnia, unspecified: Secondary | ICD-10-CM

## 2020-01-27 DIAGNOSIS — Z6838 Body mass index (BMI) 38.0-38.9, adult: Secondary | ICD-10-CM

## 2020-01-27 DIAGNOSIS — F32A Depression, unspecified: Secondary | ICD-10-CM

## 2020-01-27 MED ORDER — CITALOPRAM HYDROBROMIDE 20 MG PO TABS: 20.0000 mg | ORAL_TABLET | Freq: Every day | ORAL | 1 refills | Status: DC

## 2020-01-27 MED ORDER — ESZOPICLONE 3 MG PO TABS: 3.0000 mg | ORAL_TABLET | Freq: Every evening | ORAL | 5 refills | Status: DC | PRN

## 2020-01-27 MED FILL — ESZOPICLONE 3 MG TABS: 3 | 30 days supply | Qty: 30 | Fill #0

## 2020-01-27 MED FILL — CITALOPRAM HBR 20 MG TABLET: 20 | 90 days supply | Qty: 90 | Fill #0

## 2020-01-27 NOTE — Progress Notes (Signed)
I,Yamilka Roman [employer] as a Education administrator for [employer], FNP.,have documented all relevant documentation on the behalf of Minette Brine, FNP,as directed by  Minette Brine, FNP while in the presence of Minette Brine, [place]. This visit occurred during the SARS-CoV-2 public health emergency.  Safety protocols were in place, including screening questions prior to the visit, additional usage of staff PPE, and extensive cleaning of exam room while observing appropriate contact time as indicated for disinfecting solutions.  Subjective:     Patient ID: Kristen David , female    DOB: July 31, 1980 , 39 y.o.   MRN: [id number]   Chief Complaint  Patient presents with  . Insomnia    HPI  Here today for follow up with insomnia.  She had her baby 4 months ago, she will be 5 months Decembre 30th. She is back fulltime at [employer]. Her mother is caring for her baby when she is at work.  When she talked to her OB/GYN when she was pregnant she took 1.66m of lunesta. She is up at 430 am unable to sleep.   She reports she sleeps approximately 4 hours a night.   Wt Readings from Last 3 Encounters: 01/27/20 : 196 lb (88.9 kg) 09/02/19 : (!) 208 lb 3.2 oz (94.4 kg) 07/30/19 : 209 lb (94.8 kg)   Insomnia Primary symptoms: sleep disturbance.  The onset quality is sudden. The problem occurs nightly.     Past Medical History:  Diagnosis Date  . Anxiety   . Depression   . Fibroid   . Hidradenitis suppurativa   . History of narcotic addiction ([place]   . Suboxone maintenance treatment complicating pregnancy, antepartum ([place]   . Tachycardia    "bouts of tachycardia" up to 130-140s, sometimes at rest but usually with anxiety  . Vaginal Pap smear, abnormal      Family History  Problem Relation Age of Onset  . Diabetes Mother   . Cancer Mother        Breast  . Hypertension Father   . Gout Father      Current Outpatient Medications:  .  acetaminophen (TYLENOL) 325 MG tablet, Take 2 tablets (650 mg  total) by mouth every 4 (four) hours as needed (for pain scale < 4)., Disp: , Rfl:  .  ibuprofen (ADVIL) 600 MG tablet, Take 1 tablet (600 mg total) by mouth every 6 (six) hours., Disp: 30 tablet, Rfl: 0 .  buprenorphine (SUBUTEX) 8 MG SUBL SL tablet, Place 8 mg under the tongue in the morning and at bedtime.  (Patient not taking: Reported on 01/27/2020), Disp: , Rfl:  .  citalopram (CELEXA) 20 MG tablet, Take 1 tablet (20 mg total) by mouth daily., Disp: 90 tablet, Rfl: 1 .  eszopiclone 3 MG TABS, Take 1 tablet (3 mg total) by mouth at bedtime as needed for sleep. Take immediately before bedtime pt reports taking 334m Disp: 30 tablet, Rfl: 5 .  ferrous sulfate 325 (65 FE) MG tablet, Take 325 mg by mouth daily with breakfast. (Patient not taking: Reported on 01/27/2020), Disp: , Rfl:  .  Prenatal MV-Min-FA-Omega-3 (PRENATAL GUMMIES/DHA & FA) 0.4-32.5 MG CHEW, Chew by mouth. (Patient not taking: Reported on 01/27/2020), Disp: , Rfl:    Allergies  Allergen Reactions  . Tramadol Nausea And Vomiting     Review of Systems  Constitutional: Negative.   HENT: Negative.   Eyes: Negative.   Respiratory: Negative.   Cardiovascular: Negative.   Gastrointestinal: Negative.   Endocrine: Negative.   Genitourinary:  Negative.   Musculoskeletal: Negative.   Skin: Negative.   Neurological: Negative.   Hematological: Negative.   Psychiatric/Behavioral: Positive for sleep disturbance. The patient has insomnia.      Today's Vitals   01/27/20 1428  BP: 118/80  Pulse: 75  Temp: 98.7 F (37.1 C)  TempSrc: Oral  Weight: 196 lb (88.9 kg)  Height: 5' (1.524 m)   Body mass index is 38.28 kg/m.   Objective:  Physical Exam Vitals reviewed.  Constitutional:      General: She is not in acute distress.    Appearance: Normal appearance. She is obese.  Cardiovascular:     Rate and Rhythm: Normal rate and regular rhythm.     Pulses: Normal pulses.     Heart sounds: Normal heart sounds. No murmur  heard.   Pulmonary:     Effort: Pulmonary effort is normal. No respiratory distress.     Breath sounds: Normal breath sounds. No wheezing.  Skin:    Capillary Refill: Capillary refill takes less than 2 seconds.  Neurological:     General: No focal deficit present.     Mental Status: She is alert and oriented to person, place, and time.     Cranial Nerves: No cranial nerve deficit.  Psychiatric:        Mood and Affect: Mood normal.        Behavior: Behavior normal.        Thought Content: Thought content normal.        Judgment: Judgment normal.         Assessment And Plan:     1. Insomnia, unspecified type  She had decreased her dose of lunesta to 1.5 mg while pregnant but now feels like she needs it back, she is averaging 4 hours of sleep nightly. Will increase back to 3 mg.   - eszopiclone 3 MG TABS; Take 1 tablet (3 mg total) by mouth at bedtime as needed for sleep. Take immediately before bedtime pt reports taking 65m  Dispense: 30 tablet; Refill: 5 - citalopram (CELEXA) 20 MG tablet; Take 1 tablet (20 mg total) by mouth daily.  Dispense: 90 tablet; Refill: 1 - Vitamin B12  2. Depression, unspecified depression type  She is doing well with taking citalopram, she is not breastfeeding.   She does seem to be in better spirits since having her baby - citalopram (CELEXA) 20 MG tablet; Take 1 tablet (20 mg total) by mouth daily.  Dispense: 90 tablet; Refill: 1  3. Fatigue, unspecified type  Will check for metabolic causes  This may be related to sleep deprivation - Vitamin B12 - TSH - CMP14+EGFR  4. BMI 38.0-38.9,adult  Chronic  Discussed healthy diet and regular exercise options   Encouraged to exercise at least 150 minutes per week with 2 days of strength training  She is back to a little less than her pre-pregancy weight  She is encouraged to stay focused on healthy diet.      Patient was given opportunity to ask questions. Patient verbalized understanding  of the plan and was able to repeat key elements of the plan. All questions were answered to their satisfaction.  JMinette Brine FNP   I, JMinette Brine FNP, have reviewed all documentation for this visit. The documentation on 01/27/20 for the exam, diagnosis, procedures, and orders are all accurate and complete.   THE PATIENT IS ENCOURAGED TO PRACTICE SOCIAL DISTANCING DUE TO THE COVID-19 PANDEMIC.

## 2020-01-27 NOTE — Patient Instructions (Signed)

## 2020-01-28 LAB — CMP14+EGFR
ALT: 12 IU/L (ref 0–32)
AST: 21 IU/L (ref 0–40)
Albumin/Globulin Ratio: 1.1 — ABNORMAL LOW (ref 1.2–2.2)
Albumin: 4 g/dL (ref 3.8–4.8)
Alkaline Phosphatase: 115 IU/L (ref 44–121)
BUN/Creatinine Ratio: 12 (ref 9–23)
BUN: 11 mg/dL (ref 6–20)
Bilirubin Total: 0.3 mg/dL (ref 0.0–1.2)
CO2: 25 mmol/L (ref 20–29)
Calcium: 9.1 mg/dL (ref 8.7–10.2)
Chloride: 103 mmol/L (ref 96–106)
Creatinine, Ser: 0.89 mg/dL (ref 0.57–1.00)
GFR calc Af Amer: 94 mL/min/{1.73_m2} (ref >59)
GFR calc non Af Amer: 82 mL/min/{1.73_m2} (ref >59)
Globulin, Total: 3.6 g/dL (ref 1.5–4.5)
Glucose: 85 mg/dL (ref 65–99)
Potassium: 5 mmol/L (ref 3.5–5.2)
Sodium: 141 mmol/L (ref 134–144)
Total Protein: 7.6 g/dL (ref 6.0–8.5)

## 2020-01-28 LAB — VITAMIN B12: Vitamin B-12: 966 pg/mL (ref 232–1245)

## 2020-01-28 LAB — TSH: TSH: 0.906 u[IU]/mL (ref 0.450–4.500)

## 2020-02-02 ENCOUNTER — Telehealth: Payer: Self-pay

## 2020-02-02 NOTE — Telephone Encounter (Signed)
The pt was notified of her most recent lab results.

## 2020-02-25 MED FILL — ESZOPICLONE 3 MG TABS: 3 | 30 days supply | Qty: 30 | Fill #1

## 2020-03-27 MED FILL — ESZOPICLONE 3 MG TABS: 3 | 30 days supply | Qty: 30 | Fill #2

## 2020-04-24 ENCOUNTER — Other Ambulatory Visit: Payer: Self-pay | Admitting: Nurse Practitioner

## 2020-04-24 DIAGNOSIS — G4700 Insomnia, unspecified: Secondary | ICD-10-CM

## 2020-04-24 MED FILL — CITALOPRAM HBR 20 MG TABLET: 20 | 90 days supply | Qty: 90 | Fill #1

## 2020-04-24 MED FILL — ESZOPICLONE 3 MG TABS: 3 | 30 days supply | Qty: 30 | Fill #3

## 2020-04-26 ENCOUNTER — Ambulatory Visit: Payer: Self-pay | Admitting: Internal Medicine

## 2020-04-26 NOTE — Progress Notes (Deleted)
I,Victoria T Hamilton,acting as a scribe for Maximino Greenland, MD.,have documented all relevant documentation on the behalf of Maximino Greenland, MD,as directed by  Maximino Greenland, MD while in the presence of Maximino Greenland, MD. This visit occurred during the SARS-CoV-2 public health emergency.  Safety protocols were in place, including screening questions prior to the visit, additional usage of staff PPE, and extensive cleaning of exam room while observing appropriate contact time as indicated for disinfecting solutions.  Subjective:     Patient ID: Kristen David , female    DOB: 1980/06/27 , 40 y.o.   MRN: [id number]   No chief complaint on file.   HPI  Here today for follow up with insomnia.    She reports she sleeps...v      Insomnia Primary symptoms: sleep disturbance.  The onset quality is sudden. The problem occurs nightly.     Past Medical History:  Diagnosis Date  . Anxiety   . Depression   . Fibroid   . Hidradenitis suppurativa   . History of narcotic addiction ([place])   . Suboxone maintenance treatment complicating pregnancy, antepartum ([place])   . Tachycardia    "bouts of tachycardia" up to 130-140s, sometimes at rest but usually with anxiety  . Vaginal Pap smear, abnormal      Family History  Problem Relation Age of Onset  . Diabetes Mother   . Cancer Mother        Breast  . Hypertension Father   . Gout Father      Current Outpatient Medications:  .  acetaminophen (TYLENOL) 325 MG tablet, Take 2 tablets (650 mg total) by mouth every 4 (four) hours as needed (for pain scale < 4)., Disp: , Rfl:  .  buprenorphine (SUBUTEX) 8 MG SUBL SL tablet, Place 8 mg under the tongue in the morning and at bedtime.  (Patient not taking: Reported on 01/27/2020), Disp: , Rfl:  .  citalopram (CELEXA) 20 MG tablet, Take 1 tablet (20 mg total) by mouth daily., Disp: 90 tablet, Rfl: 1 .  eszopiclone 3 MG TABS, Take 1 tablet (3 mg total) by mouth at bedtime as needed for sleep.  Take immediately before bedtime pt reports taking 3mg , Disp: 30 tablet, Rfl: 5 .  ferrous sulfate 325 (65 FE) MG tablet, Take 325 mg by mouth daily with breakfast. (Patient not taking: Reported on 01/27/2020), Disp: , Rfl:  .  ibuprofen (ADVIL) 600 MG tablet, Take 1 tablet (600 mg total) by mouth every 6 (six) hours., Disp: 30 tablet, Rfl: 0 .  Prenatal MV-Min-FA-Omega-3 (PRENATAL GUMMIES/DHA & FA) 0.4-32.5 MG CHEW, Chew by mouth. (Patient not taking: Reported on 01/27/2020), Disp: , Rfl:    Allergies  Allergen Reactions  . Tramadol Nausea And Vomiting     Review of Systems  Constitutional: Negative.   HENT: Negative.   Eyes: Negative.   Respiratory: Negative.   Cardiovascular: Negative.   Gastrointestinal: Negative.   Endocrine: Negative.   Genitourinary: Negative.   Musculoskeletal: Negative.   Skin: Negative.   Allergic/Immunologic: Negative.   Neurological: Negative.   Hematological: Negative.   Psychiatric/Behavioral: Positive for sleep disturbance. The patient has insomnia.      There were no vitals filed for this visit. There is no height or weight on file to calculate BMI.   Objective:  Physical Exam      Assessment And Plan:     1. Insomnia, unspecified type     Patient was given opportunity to ask questions.  Patient verbalized understanding of the plan and was able to repeat key elements of the plan. All questions were answered to their satisfaction.  Debbora Dus, [name]   I, Debbora Dus, [name], have reviewed all documentation for this visit. The documentation on 04/26/20 for the exam, diagnosis, procedures, and orders are all accurate and complete.   IF YOU HAVE BEEN REFERRED TO A SPECIALIST, IT MAY TAKE 1-2 WEEKS TO SCHEDULE/PROCESS THE REFERRAL. IF YOU HAVE NOT HEARD FROM US/SPECIALIST IN TWO WEEKS, PLEASE GIVE [place] A CALL AT [phone].   THE PATIENT IS ENCOURAGED TO PRACTICE SOCIAL DISTANCING DUE TO THE COVID-19 PANDEMIC.

## 2020-05-24 ENCOUNTER — Ambulatory Visit (INDEPENDENT_AMBULATORY_CARE_PROVIDER_SITE_OTHER): Payer: Self-pay | Admitting: Nurse Practitioner

## 2020-05-24 ENCOUNTER — Other Ambulatory Visit: Payer: Self-pay

## 2020-05-24 ENCOUNTER — Encounter: Payer: Self-pay | Admitting: Nurse Practitioner

## 2020-05-24 VITALS — BP 108/76 | HR 73 | Temp 98.2°F | Ht 60.0 in | Wt 202.4 lb

## 2020-05-24 DIAGNOSIS — R7309 Other abnormal glucose: Secondary | ICD-10-CM

## 2020-05-24 DIAGNOSIS — E6609 Other obesity due to excess calories: Secondary | ICD-10-CM

## 2020-05-24 DIAGNOSIS — Z6839 Body mass index (BMI) 39.0-39.9, adult: Secondary | ICD-10-CM

## 2020-05-24 MED ORDER — PHENTERMINE HCL 15 MG PO CAPS: 15.0000 mg | ORAL_CAPSULE | ORAL | 1 refills | Status: DC

## 2020-05-24 MED ORDER — PHENTERMINE HCL 15 MG PO CAPS
15.0000 mg | ORAL_CAPSULE | ORAL | 1 refills | Status: DC
  Filled 2020-05-24: qty 30, 30d supply, fill #0

## 2020-05-24 NOTE — Progress Notes (Signed)
Rutherford Nail as a scribe for Minette Brine, FNP.,have documented all relevant documentation on the behalf of Minette Brine, FNP,as directed by  Minette Brine, FNP while in the presence of Minette Brine, [place].  This visit occurred during the SARS-CoV-2 public health emergency.  Safety protocols were in place, including screening questions prior to the visit, additional usage of staff PPE, and extensive cleaning of exam room while observing appropriate contact time as indicated for disinfecting solutions.  Subjective:     Patient ID: Kristen David , female    DOB: [birth year] , 40 y.o.   MRN: [id number]   Chief Complaint  Patient presents with  . Obesity    Phentermine     HPI  Pt here today wanting to try a medication for weight loss.  She is now working out with a Physiological scientist and eating better, she has lost 3 lbs since starting. She has a new baby about 6-8 months.  She did not gain much weight with her pregnancy about 15 lbs. But now not moving she is also eating when she can, will tend to snack and has gained her weight back. She is no longer breast feeding. She does not have the energy to work out, also working 12 hour shifts, and having challenging time to sleep due to the baby waking up at night. She feels she is overwhelmed with things to do at home.  She was trying to do a virtual weight loss plan.  She was on topiramate but caused her sleepiness.  She has taken phentermine in the past for 4 months.  She goes to the trainer 3 times a week and cardio 1-2 times without the trainer.  She is doing portion control. 2 protein shakes and a meal, 2 snacks in between.    Wt Readings from Last 3 Encounters: 05/24/20 : 202 lb 6.4 oz (91.8 kg) 01/27/20 : 196 lb (88.9 kg) 09/02/19 : (!) 208 lb 3.2 oz (94.4 kg)     Past Medical History:  Diagnosis Date  . Anxiety   . Depression   . Fibroid   . Hidradenitis suppurativa   . History of narcotic addiction ([place])   . Suboxone  maintenance treatment complicating pregnancy, antepartum ([place])   . Tachycardia    "bouts of tachycardia" up to 130-140s, sometimes at rest but usually with anxiety  . Vaginal Pap smear, abnormal      Family History  Problem Relation Age of Onset  . Diabetes Mother   . Cancer Mother        Breast  . Hypertension Father   . Gout Father      Current Outpatient Medications:  .  citalopram (CELEXA) 20 MG tablet, TAKE 1 TABLET (20 MG TOTAL) BY MOUTH DAILY., Disp: 90 tablet, Rfl: 1 .  Eszopiclone 3 MG TABS, TAKE 1 TABLET BY MOUTH IMMEDIATELY BEFORE BEDTIME AS NEEDED FOR SLEEP., Disp: 30 tablet, Rfl: 5 .  ibuprofen (ADVIL) 600 MG tablet, Take 1 tablet (600 mg total) by mouth every 6 (six) hours., Disp: 30 tablet, Rfl: 0 .  citalopram (CELEXA) 20 MG tablet, TAKE 1 TABLET BY MOUTH ONCE DAILY, Disp: 30 tablet, Rfl: 6 .  phentermine 15 MG capsule, Take 1 capsule (15 mg total) by mouth every morning., Disp: 30 capsule, Rfl: 1 .  Semaglutide (RYBELSUS) 3 MG TABS, Take 1 tablet by mouth daily at 6 (six) AM., Disp: 90 tablet, Rfl: 0   Allergies  Allergen Reactions  . Tramadol Nausea And Vomiting  Review of Systems  Constitutional: Negative.  Negative for fatigue.  HENT: Negative.   Respiratory: Negative.   Cardiovascular: Negative.  Negative for chest pain, palpitations and leg swelling.  Endocrine: Negative for polydipsia, polyphagia and polyuria.  Musculoskeletal: Negative.   Skin: Negative.   Neurological: Negative for dizziness and headaches.  Psychiatric/Behavioral: Negative.      Today's Vitals   05/24/20 1625  BP: 108/76  Pulse: 73  Temp: 98.2 F (36.8 C)  TempSrc: Oral  Weight: 202 lb 6.4 oz (91.8 kg)  Height: 5' (1.524 m)   Body mass index is 39.53 kg/m.  Wt Readings from Last 3 Encounters:  05/24/20 202 lb 6.4 oz (91.8 kg)  01/27/20 196 lb (88.9 kg)  09/02/19 (!) 208 lb 3.2 oz (94.4 kg)   Objective:  Physical Exam Vitals reviewed.  Constitutional:       General: She is not in acute distress.    Appearance: Normal appearance.  Cardiovascular:     Rate and Rhythm: Normal rate and regular rhythm.     Pulses: Normal pulses.     Heart sounds: Normal heart sounds. No murmur heard.   Pulmonary:     Effort: Pulmonary effort is normal. No respiratory distress.     Breath sounds: Normal breath sounds. No wheezing.  Neurological:     General: No focal deficit present.     Mental Status: She is alert and oriented to person, place, and time.     Cranial Nerves: No cranial nerve deficit.  Psychiatric:        Mood and Affect: Mood normal.        Behavior: Behavior normal.        Thought Content: Thought content normal.        Judgment: Judgment normal.         Assessment And Plan:     1. Class 2 obesity due to excess calories without serious comorbidity with body mass index (BMI) of 39.0 to 39.9 in adult  Will start her on phentermine 15 mg, she is aware of side effects to include increased heart rate or difficulty with sleeping.   Goal is to lose 1-2 lbs a week  She will return in 2 months for weight check  She is not breast feeding  She is encouraged to strive for BMI less than 30 to decrease cardiac risk. Advised to aim for at least 150 minutes of exercise per week. - phentermine 15 MG capsule; Take 1 capsule (15 mg total) by mouth every morning.  Dispense: 30 capsule; Refill: 1  2. Abnormal glucose  Will check random insulin to see if she is insulin resistant  Encouraged to eat a low carbohydrate and low sugar diet - Hemoglobin A1c - Insulin, random    Patient was given opportunity to ask questions. Patient verbalized understanding of the plan and was able to repeat key elements of the plan. All questions were answered to their satisfaction.  Minette Brine, FNP   I, Minette Brine, FNP, have reviewed all documentation for this visit. The documentation on 05/24/20 for the exam, diagnosis, procedures, and orders are all accurate  and complete.   IF YOU HAVE BEEN REFERRED TO A SPECIALIST, IT MAY TAKE 1-2 WEEKS TO SCHEDULE/PROCESS THE REFERRAL. IF YOU HAVE NOT HEARD FROM US/SPECIALIST IN TWO WEEKS, PLEASE GIVE [place] A CALL AT [phone].   THE PATIENT IS ENCOURAGED TO PRACTICE SOCIAL DISTANCING DUE TO THE COVID-19 PANDEMIC.

## 2020-05-25 ENCOUNTER — Other Ambulatory Visit (HOSPITAL_COMMUNITY): Payer: Self-pay

## 2020-05-25 LAB — INSULIN, RANDOM: INSULIN: 19.1 u[IU]/mL (ref 2.6–24.9)

## 2020-05-25 LAB — HEMOGLOBIN A1C
Est. average glucose Bld gHb Est-mCnc: 128 mg/dL
Hgb A1c MFr Bld: 6.1 % — ABNORMAL HIGH (ref 4.8–5.6)

## 2020-05-26 ENCOUNTER — Other Ambulatory Visit (HOSPITAL_COMMUNITY): Payer: Self-pay

## 2020-05-26 ENCOUNTER — Telehealth: Payer: Self-pay

## 2020-05-26 MED FILL — Eszopiclone Tab 3 MG: ORAL | 30 days supply | Qty: 30 | Fill #0 | Status: AC

## 2020-05-26 NOTE — Telephone Encounter (Signed)
-----   Message from Minette Brine, [place] sent at 05/25/2020  8:40 AM EDT ----- Your HgbA1c is still at 6.1 this is prediabetic, would you like to try a medication called Rybelsus - this helps to curb your appetite because it improves gastric emptying and helps you to feel full. Nausea and constipation are the main side effects,  I encourage people to not overeat and to take a stool softner 3 days a week to help with the constipation and drink at least 64 oz water? Insulin levels are normal.

## 2020-05-26 NOTE — Telephone Encounter (Signed)
I left the pt a message she can call back for her lab results or log on to her mychart acccout

## 2020-05-31 ENCOUNTER — Other Ambulatory Visit: Payer: Self-pay

## 2020-05-31 ENCOUNTER — Other Ambulatory Visit (HOSPITAL_COMMUNITY): Payer: Self-pay

## 2020-05-31 DIAGNOSIS — R7303 Prediabetes: Secondary | ICD-10-CM

## 2020-05-31 MED ORDER — RYBELSUS 3 MG PO TABS
1.0000 | ORAL_TABLET | Freq: Every day | ORAL | 0 refills | Status: DC
  Filled 2020-05-31: qty 30, 30d supply, fill #0

## 2020-06-08 ENCOUNTER — Other Ambulatory Visit (HOSPITAL_COMMUNITY): Payer: Self-pay

## 2020-06-27 ENCOUNTER — Ambulatory Visit: Payer: Self-pay | Admitting: Nurse Practitioner

## 2020-07-20 ENCOUNTER — Other Ambulatory Visit (HOSPITAL_COMMUNITY): Payer: Self-pay

## 2020-07-20 MED FILL — Eszopiclone Tab 3 MG: ORAL | 30 days supply | Qty: 30 | Fill #1 | Status: AC

## 2020-08-23 ENCOUNTER — Encounter: Payer: Self-pay | Admitting: Nurse Practitioner

## 2020-08-23 ENCOUNTER — Other Ambulatory Visit: Payer: Self-pay | Admitting: Nurse Practitioner

## 2020-08-23 DIAGNOSIS — G4700 Insomnia, unspecified: Secondary | ICD-10-CM

## 2020-08-23 DIAGNOSIS — F32A Depression, unspecified: Secondary | ICD-10-CM

## 2020-08-24 ENCOUNTER — Other Ambulatory Visit (HOSPITAL_COMMUNITY): Payer: Self-pay

## 2020-08-24 ENCOUNTER — Ambulatory Visit (INDEPENDENT_AMBULATORY_CARE_PROVIDER_SITE_OTHER): Payer: Self-pay | Admitting: Nurse Practitioner

## 2020-08-24 ENCOUNTER — Encounter: Payer: Self-pay | Admitting: Nurse Practitioner

## 2020-08-24 ENCOUNTER — Other Ambulatory Visit: Payer: Self-pay

## 2020-08-24 VITALS — BP 124/80 | HR 80 | Temp 98.3°F | Ht 62.2 in | Wt 198.4 lb

## 2020-08-24 DIAGNOSIS — E6609 Other obesity due to excess calories: Secondary | ICD-10-CM

## 2020-08-24 DIAGNOSIS — Z6836 Body mass index (BMI) 36.0-36.9, adult: Secondary | ICD-10-CM

## 2020-08-24 DIAGNOSIS — G4700 Insomnia, unspecified: Secondary | ICD-10-CM

## 2020-08-24 DIAGNOSIS — L03111 Cellulitis of right axilla: Secondary | ICD-10-CM

## 2020-08-24 DIAGNOSIS — F32A Depression, unspecified: Secondary | ICD-10-CM

## 2020-08-24 DIAGNOSIS — L732 Hidradenitis suppurativa: Secondary | ICD-10-CM

## 2020-08-24 DIAGNOSIS — L02421 Furuncle of right axilla: Secondary | ICD-10-CM

## 2020-08-24 DIAGNOSIS — R7309 Other abnormal glucose: Secondary | ICD-10-CM

## 2020-08-24 MED ORDER — ESZOPICLONE 3 MG PO TABS
3.0000 mg | ORAL_TABLET | Freq: Every evening | ORAL | 5 refills | Status: DC | PRN
  Filled 2020-08-24: qty 30, 30d supply, fill #0
  Filled 2020-09-28: qty 30, 30d supply, fill #1
  Filled 2020-10-27: qty 30, 30d supply, fill #2
  Filled 2020-12-01: qty 30, 30d supply, fill #3

## 2020-08-24 MED ORDER — CITALOPRAM HYDROBROMIDE 20 MG PO TABS
20.0000 mg | ORAL_TABLET | Freq: Every day | ORAL | 1 refills | Status: DC
  Filled 2020-08-24: qty 90, 90d supply, fill #0
  Filled 2020-12-01: qty 90, 90d supply, fill #1

## 2020-08-24 MED ORDER — RYBELSUS 7 MG PO TABS
1.0000 | ORAL_TABLET | Freq: Every day | ORAL | 1 refills | Status: DC
  Filled 2020-08-24: qty 60, 60d supply, fill #0

## 2020-08-24 MED ORDER — HYDROCODONE-ACETAMINOPHEN 5-325 MG PO TABS
1.0000 | ORAL_TABLET | Freq: Four times a day (QID) | ORAL | 0 refills | Status: DC | PRN
  Filled 2020-08-24: qty 30, 8d supply, fill #0

## 2020-08-24 MED ORDER — CEPHALEXIN 500 MG PO CAPS
500.0000 mg | ORAL_CAPSULE | Freq: Four times a day (QID) | ORAL | 0 refills | Status: AC
  Filled 2020-08-24: qty 40, 10d supply, fill #0

## 2020-08-24 MED ORDER — PHENTERMINE HCL 37.5 MG PO CAPS
37.5000 mg | ORAL_CAPSULE | ORAL | 1 refills | Status: DC
  Filled 2020-08-24: qty 30, 30d supply, fill #0
  Filled 2020-11-13: qty 30, 30d supply, fill #1

## 2020-08-24 NOTE — Progress Notes (Signed)
I,Yamilka Roman [employer] as a Education administrator for [employer], FNP.,have documented all relevant documentation on the behalf of Minette Brine, FNP,as directed by  Minette Brine, FNP while in the presence of Minette Brine, [place].   This visit occurred during the SARS-CoV-2 public health emergency.  Safety protocols were in place, including screening questions prior to the visit, additional usage of staff PPE, and extensive cleaning of exam room while observing appropriate contact time as indicated for disinfecting solutions.  Subjective:     Patient ID: Kristen David , female    DOB: 06-10-1980 , 40 y.o.   MRN: [id number]   Chief Complaint  Patient presents with   Mass    Patient has a boil in her right armpit she stated  she gets them frequently this one has been there for the past month and it is painful.    Weight Check   Insomnia    HPI  Patient presents today for a f/u on her weight and insomnia. She also reports having a boil under her right armpit; feels like the boil has been present for more than one month. Has gotten progressively worse and is unbearable now and has had low energy level.  She is taking Phentermine 15 mg daily most times. Diet is better, has not been eating as much.    Wt Readings from Last 3 Encounters: 08/24/20 : 198 lb 6.4 oz (90 kg) 05/24/20 : 202 lb 6.4 oz (91.8 kg) 01/27/20 : 196 lb (88.9 kg)     Insomnia Primary symptoms: sleep disturbance.   The onset quality is sudden. The problem occurs nightly. The problem has been gradually improving since onset.    Past Medical History:  Diagnosis Date   Anxiety    Depression    Fibroid    Hidradenitis suppurativa    History of narcotic addiction ([place])    Suboxone maintenance treatment complicating pregnancy, antepartum ([facility])    Tachycardia    "bouts of tachycardia" up to 130-140s, sometimes at rest but usually with anxiety   Vaginal Pap smear, abnormal      Family History  Problem Relation Age of Onset    Diabetes Mother    Cancer Mother        Breast   Hypertension Father    Gout Father      Current Outpatient Medications:    HYDROcodone-acetaminophen (NORCO/VICODIN) 5-325 MG tablet, Take 1 tablet by mouth every 6 (six) hours as needed for moderate pain., Disp: 30 tablet, Rfl: 0   ibuprofen (ADVIL) 600 MG tablet, Take 1 tablet (600 mg total) by mouth every 6 (six) hours., Disp: 30 tablet, Rfl: 0   phentermine 37.5 MG capsule, Take 1 capsule (37.5 mg total) by mouth every morning., Disp: 30 capsule, Rfl: 1   Semaglutide (RYBELSUS) 7 MG TABS, Take 1 tablet by mouth daily., Disp: 30 tablet, Rfl: 1   citalopram (CELEXA) 20 MG tablet, Take 1 tablet (20 mg total) by mouth daily., Disp: 90 tablet, Rfl: 1   Eszopiclone 3 MG TABS, Take 1 tablet (3 mg total) by mouth immediately before bedtime as needed for sleep, Disp: 30 tablet, Rfl: 5   Allergies  Allergen Reactions   Tramadol Nausea And Vomiting     Review of Systems  Constitutional: Negative.  Negative for fatigue.  HENT: Negative.    Respiratory: Negative.    Cardiovascular: Negative.  Negative for chest pain, palpitations and leg swelling.  Endocrine: Negative for polydipsia, polyphagia and polyuria.  Musculoskeletal: Negative.  Skin: Negative.        Right axilla with open area and tender to touch with yellow drainage.   Neurological:  Negative for dizziness and headaches.  Psychiatric/Behavioral:  Positive for sleep disturbance. The patient has insomnia.     Today's Vitals   08/24/20 1100  BP: 124/80  Pulse: 80  Temp: 98.3 F (36.8 C)  Weight: 198 lb 6.4 oz (90 kg)  Height: 5' 2.2" (1.58 m)  PainSc: 8    Body mass index is 36.05 kg/m.   Objective:  Physical Exam Vitals reviewed.  Constitutional:      General: She is not in acute distress.    Appearance: Normal appearance.  Cardiovascular:     Rate and Rhythm: Normal rate and regular rhythm.     Pulses: Normal pulses.     Heart sounds: Normal heart sounds. No  murmur heard. Pulmonary:     Effort: Pulmonary effort is normal. No respiratory distress.     Breath sounds: Normal breath sounds. No wheezing.  Skin:    General: Skin is warm.     Comments: Right axilla has a firm mass present, tender to touch with opening.  Neurological:     General: No focal deficit present.     Mental Status: She is alert and oriented to person, place, and time.     Cranial Nerves: No cranial nerve deficit.  Psychiatric:        Mood and Affect: Mood normal.        Behavior: Behavior normal.        Thought Content: Thought content normal.        Judgment: Judgment normal.        Assessment And Plan:     1. Abnormal glucose Comments: Samples of Rybelsus given - Semaglutide (RYBELSUS) 7 MG TABS; Take 1 tablet by mouth daily.  Dispense: 30 tablet; Refill: 1  2. Class 2 obesity due to excess calories with body mass index (BMI) of 36.0 to 36.9 in adult, unspecified whether serious comorbidity present Comments: Continue phentermine Tolerating medications well - phentermine 37.5 MG capsule; Take 1 capsule (37.5 mg total) by mouth every morning.  Dispense: 30 capsule; Refill: 1  3. Insomnia, unspecified type Comments: Doing well with Lunesta - Eszopiclone 3 MG TABS; Take 1 tablet (3 mg total) by mouth immediately before bedtime as needed for sleep  Dispense: 30 tablet; Refill: 5 - citalopram (CELEXA) 20 MG tablet; Take 1 tablet (20 mg total) by mouth daily.  Dispense: 90 tablet; Refill: 1  4. Depression, unspecified depression type Comments: Stable,  - citalopram (CELEXA) 20 MG tablet; Take 1 tablet (20 mg total) by mouth daily.  Dispense: 90 tablet; Refill: 1  5. Furuncle of right axilla  6. Cellulitis of right axilla Comments: Erythema, tenderness to right axilla Will treat with antibiotic, if not better return call to office - cephALEXin (KEFLEX) 500 MG capsule; Take 1 capsule (500 mg total) by mouth 4 (four) times daily for 10 days.  Dispense: 40 capsule;  Refill: 0 - HYDROcodone-acetaminophen (NORCO/VICODIN) 5-325 MG tablet; Take 1 tablet by mouth every 6 (six) hours as needed for moderate pain.  Dispense: 30 tablet; Refill: 0  7. Hidradenitis axillaris - cephALEXin (KEFLEX) 500 MG capsule; Take 1 capsule (500 mg total) by mouth 4 (four) times daily for 10 days.  Dispense: 40 capsule; Refill: 0 - HYDROcodone-acetaminophen (NORCO/VICODIN) 5-325 MG tablet; Take 1 tablet by mouth every 6 (six) hours as needed for moderate pain.  Dispense: 30  tablet; Refill: 0    Patient was given opportunity to ask questions. Patient verbalized understanding of the plan and was able to repeat key elements of the plan. All questions were answered to their satisfaction.  Minette Brine, FNP   I, Minette Brine, FNP, have reviewed all documentation for this visit. The documentation on 08/24/20 for the exam, diagnosis, procedures, and orders are all accurate and complete.   IF YOU HAVE BEEN REFERRED TO A SPECIALIST, IT MAY TAKE 1-2 WEEKS TO SCHEDULE/PROCESS THE REFERRAL. IF YOU HAVE NOT HEARD FROM US/SPECIALIST IN TWO WEEKS, PLEASE GIVE [place] A CALL AT [phone].   THE PATIENT IS ENCOURAGED TO PRACTICE SOCIAL DISTANCING DUE TO THE COVID-19 PANDEMIC.

## 2020-08-24 NOTE — Patient Instructions (Signed)
Exercising to Lose Weight Exercise is structured, repetitive physical activity to improve fitness and health. Getting regular exercise is important for everyone. It is especially important if you are overweight. Being overweight increases your risk of heart disease, stroke, diabetes, high blood pressure, and several types of cancer.Reducing your calorie intake and exercising can help you lose weight. Exercise is usually categorized as moderate or vigorous intensity. To lose weight, most people need to do a certain amount of moderate-intensity orvigorous-intensity exercise each week. Moderate-intensity exercise  Moderate-intensity exercise is any activity that gets you moving enough to burn at least three times more energy (calories) than if you were sitting. Examples of moderate exercise include: Walking a mile in 15 minutes. Doing light yard work. Biking at an easy pace. Most people should get at least 150 minutes (2 hours and 30 minutes) a week ofmoderate-intensity exercise to maintain their body weight. Vigorous-intensity exercise Vigorous-intensity exercise is any activity that gets you moving enough to burn at least six times more calories than if you were sitting. When you exercise at this intensity, you should be working hard enough that you are not able tocarry on a conversation. Examples of vigorous exercise include: Running. Playing a team sport, such as football, basketball, and soccer. Jumping rope. Most people should get at least 75 minutes (1 hour and 15 minutes) a week ofvigorous-intensity exercise to maintain their body weight. How can exercise affect me? When you exercise enough to burn more calories than you eat, you lose weight. Exercise also reduces body fat and builds muscle. The more muscle you have, the more calories you burn. Exercise also: Improves mood. Reduces stress and tension. Improves your overall fitness, flexibility, and endurance. Increases bone strength. The  amount of exercise you need to lose weight depends on: Your age. The type of exercise. Any health conditions you have. Your overall physical ability. Talk to your health care provider about how much exercise you need and whattypes of activities are safe for you. What actions can I take to lose weight? Nutrition  Make changes to your diet as told by your health care provider or diet and nutrition specialist (dietitian). This may include: Eating fewer calories. Eating more protein. Eating less unhealthy fats. Eating a diet that includes fresh fruits and vegetables, whole grains, low-fat dairy products, and lean protein. Avoiding foods with added fat, salt, and sugar. Drink plenty of water while you exercise to prevent dehydration or heat stroke.  Activity Choose an activity that you enjoy and set realistic goals. Your health care provider can help you make an exercise plan that works for you. Exercise at a moderate or vigorous intensity most days of the week. The intensity of exercise may vary from person to person. You can tell how intense a workout is for you by paying attention to your breathing and heartbeat. Most people will notice their breathing and heartbeat get faster with more intense exercise. Do resistance training twice each week, such as: Push-ups. Sit-ups. Lifting weights. Using resistance bands. Getting short amounts of exercise can be just as helpful as long structured periods of exercise. If you have trouble finding time to exercise, try to include exercise in your daily routine. Get up, stretch, and walk around every 30 minutes throughout the day. Go for a walk during your lunch break. Park your car farther away from your destination. If you take public transportation, get off one stop early and walk the rest of the way. Make phone calls while standing up and   walking around. Take the stairs instead of elevators or escalators. Wear comfortable clothes and shoes with  good support. Do not exercise so much that you hurt yourself, feel dizzy, or get very short of breath. Where to find more information [place] Department of Health and Human Services: www.hhs.gov Centers for Disease Control and Prevention (CDC): www.cdc.gov Contact a health care provider: Before starting a new exercise program. If you have questions or concerns about your weight. If you have a medical problem that keeps you from exercising. Get help right away if you have any of the following while exercising: Injury. Dizziness. Difficulty breathing or shortness of breath that does not go away when you stop exercising. Chest pain. Rapid heartbeat. Summary Being overweight increases your risk of heart disease, stroke, diabetes, high blood pressure, and several types of cancer. Losing weight happens when you burn more calories than you eat. Reducing the amount of calories you eat in addition to getting regular moderate or vigorous exercise each week helps you lose weight. This information is not intended to replace advice given to you by your health care provider. Make sure you discuss any questions you have with your healthcare provider. Document Revised: 05/03/2019 Document Reviewed: 05/20/2019 Elsevier Patient Education  2022 [employer]  

## 2020-08-30 ENCOUNTER — Ambulatory Visit: Payer: Self-pay | Admitting: Nurse Practitioner

## 2020-09-28 ENCOUNTER — Other Ambulatory Visit (HOSPITAL_COMMUNITY): Payer: Self-pay

## 2020-10-27 ENCOUNTER — Other Ambulatory Visit (HOSPITAL_COMMUNITY): Payer: Self-pay

## 2020-10-30 ENCOUNTER — Encounter: Payer: Self-pay | Admitting: Nurse Practitioner

## 2020-10-30 ENCOUNTER — Other Ambulatory Visit: Payer: Self-pay

## 2020-10-30 ENCOUNTER — Ambulatory Visit (INDEPENDENT_AMBULATORY_CARE_PROVIDER_SITE_OTHER): Payer: Self-pay | Admitting: Nurse Practitioner

## 2020-10-30 ENCOUNTER — Other Ambulatory Visit (HOSPITAL_COMMUNITY): Payer: Self-pay

## 2020-10-30 VITALS — BP 110/60 | HR 85 | Temp 98.1°F | Ht 62.2 in | Wt 196.5 lb

## 2020-10-30 DIAGNOSIS — Z6835 Body mass index (BMI) 35.0-35.9, adult: Secondary | ICD-10-CM

## 2020-10-30 DIAGNOSIS — R7309 Other abnormal glucose: Secondary | ICD-10-CM

## 2020-10-30 DIAGNOSIS — E6609 Other obesity due to excess calories: Secondary | ICD-10-CM

## 2020-10-30 MED ORDER — OZEMPIC (0.25 OR 0.5 MG/DOSE) 2 MG/1.5ML ~~LOC~~ SOPN
0.5000 mg | PEN_INJECTOR | SUBCUTANEOUS | 1 refills | Status: DC
  Filled 2020-10-30 – 2020-12-22 (×2): qty 4.5, 84d supply, fill #0

## 2020-10-30 NOTE — Patient Instructions (Signed)
Obesity, Adult Obesity is having too much body fat. Being obese means that your weight is more than what is healthy for you. BMI is a number that explains how much body fat you have. If you have a BMI of 30 or more, you are obese. Obesity is often caused by eating or drinking more calories than your body uses. Changing your lifestyle can help you lose weight. Obesity can cause serious health problems, such as: Stroke. Coronary artery disease (CAD). Type 2 diabetes. Some types of cancer, including cancers of the colon, breast, uterus, and gallbladder. Osteoarthritis. High blood pressure (hypertension). High cholesterol. Sleep apnea. Gallbladder stones. Infertility problems. What are the causes? Eating meals each day that are high in calories, sugar, and fat. Being born with genes that may make you more likely to become obese. Having a medical condition that causes obesity. Taking certain medicines. Sitting a lot (having a sedentary lifestyle). Not getting enough sleep. Drinking a lot of drinks that have sugar in them. What increases the risk? Having a family history of obesity. Being an [place] American woman. Being a Hispanic man. Living in an area with limited access to: [place], recreation centers, or sidewalks. Healthy food choices, such as grocery stores and farmers' markets. What are the signs or symptoms? The main sign is having too much body fat. How is this treated? Treatment for this condition often includes changing your lifestyle. Treatment may include: Changing your diet. This may include making a healthy meal plan. Exercise. This may include activity that causes your heart to beat faster (aerobic exercise) and strength training. Work with your doctor to design a program that works for you. Medicine to help you lose weight. This may be used if you are not able to lose 1 pound a week after 6 weeks of healthy eating and more exercise. Treating conditions that cause the  obesity. Surgery. Options may include gastric banding and gastric bypass. This may be done if: Other treatments have not helped to improve your condition. You have a BMI of 40 or higher. You have life-threatening health problems related to obesity. Follow these instructions at home: Eating and drinking  Follow advice from your doctor about what to eat and drink. Your doctor may tell you to: Limit fast food, sweets, and processed snack foods. Choose low-fat options. For example, choose low-fat milk instead of whole milk. Eat 5 or more servings of fruits or vegetables each day. Eat at home more often. This gives you more control over what you eat. Choose healthy foods when you eat out. Learn to read food labels. This will help you learn how much food is in 1 serving. Keep low-fat snacks available. Avoid drinks that have a lot of sugar in them. These include soda, fruit juice, iced tea with sugar, and flavored milk. Drink enough water to keep your pee (urine) pale yellow. Do not go on fad diets. Physical activity Exercise often, as told by your doctor. Most adults should get up to 150 minutes of moderate-intensity exercise every week.Ask your doctor: What types of exercise are safe for you. How often you should exercise. Warm up and stretch before being active. Do slow stretching after being active (cool down). Rest between times of being active. Lifestyle Work with your doctor and a food expert (dietitian) to set a weight-loss goal that is best for you. Limit your screen time. Find ways to reward yourself that do not involve food. Do not drink alcohol if: Your doctor tells you not to  drink. You are pregnant, may be pregnant, or are planning to become pregnant. If you drink alcohol: Limit how much you use to: 0-1 drink a day for women. 0-2 drinks a day for men. Be aware of how much alcohol is in your drink. In the [place] one drink equals one 12 oz bottle of beer (355 mL), one 5 oz  glass of wine (148 mL), or one 1 oz glass of hard liquor (44 mL). General instructions Keep a weight-loss journal. This can help you keep track of: The food that you eat. How much exercise you get. Take over-the-counter and prescription medicines only as told by your doctor. Take vitamins and supplements only as told by your doctor. Think about joining a support group. Keep all follow-up visits as told by your doctor. This is important. Contact a doctor if: You cannot meet your weight loss goal after you have changed your diet and lifestyle for 6 weeks. Get help right away if you: Are having trouble breathing. Are having thoughts of harming yourself. Summary Obesity is having too much body fat. Being obese means that your weight is more than what is healthy for you. Work with your doctor to set a weight-loss goal. Get regular exercise as told by your doctor. This information is not intended to replace advice given to you by your health care provider. Make sure you discuss any questions you have with your health care provider. Document Revised: 09/25/2017 Document Reviewed: 09/25/2017 Elsevier Patient Education  2022 [employer].

## 2020-10-30 NOTE — Progress Notes (Signed)
I,Tianna Badgett,acting as a Education administrator for [employer], FNP.,have documented all relevant documentation on the behalf of Minette Brine, FNP,as directed by  Minette Brine, FNP while in the presence of Minette Brine, [place]..  This visit occurred during the SARS-CoV-2 public health emergency.  Safety protocols were in place, including screening questions prior to the visit, additional usage of staff PPE, and extensive cleaning of exam room while observing appropriate contact time as indicated for disinfecting solutions.  Subjective:     Patient ID: Kristen David , female    DOB: [birth year] , 40 y.o.   MRN: [id number]   Chief Complaint  Patient presents with   Weight Check    HPI  Patient presents today for a f/u on her weight.  She is unable to tolerate the rybelsus 3 mg so she is not taking the medications. She denies eating fatty foods or sugary foods. She was unable to finish the medication due to the nausea.  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 She reports "she does not eat much when she does". She will do protein shakes.   She is on suboxone 1/2 dose, she is going through [employer]. She had been on Percocet for an extended period of time for her leg pain and hidradenitis (she had surgery on her thigh and right axilla).  She is being followed by her [facility].   Wt Readings from Last 3 Encounters: 10/30/20 : 196 lb 8 oz (89.1 kg) 08/24/20 : 198 lb 6.4 oz (90 kg) 05/24/20 : 202 lb 6.4 oz (91.8 kg)      Past Medical History:  Diagnosis Date   Anxiety    Depression    Fibroid    Hidradenitis suppurativa    History of narcotic addiction ([place])    Suboxone maintenance treatment complicating pregnancy, antepartum (HCC)    Tachycardia    "bouts of tachycardia" up to 130-140s, sometimes at rest but usually with anxiety   Vaginal Pap smear, abnormal      Family History  Problem Relation Age of Onset    Diabetes Mother    Cancer Mother        Breast   Hypertension Father    Gout Father      Current Outpatient Medications:    Semaglutide,0.25 or 0.5MG /DOS, (OZEMPIC, 0.25 OR 0.5 MG/DOSE,) 2 MG/1.5ML SOPN, Inject 0.5 mg into the skin once a week., Disp: 4.5 mL, Rfl: 1   buprenorphine (SUBUTEX) 8 MG SUBL SL tablet, Place 8 mg under the tongue daily., Disp: , Rfl:    citalopram (CELEXA) 20 MG tablet, Take 1 tablet (20 mg total) by mouth daily., Disp: 90 tablet, Rfl: 1   Eszopiclone 3 MG TABS, Take 1 tablet (3 mg total) by mouth immediately before bedtime as needed for sleep, Disp: 30 tablet, Rfl: 5   ibuprofen (ADVIL) 600 MG tablet, Take 1 tablet (600 mg total) by mouth every 6 (six) hours., Disp: 30 tablet, Rfl: 0   phentermine 37.5 MG capsule, Take 1 capsule (37.5 mg total) by mouth every morning., Disp: 30 capsule, Rfl: 1   Allergies  Allergen Reactions   Tramadol Nausea And Vomiting     Review of Systems  Constitutional: Negative.  Negative for fatigue.  Respiratory: Negative.    Cardiovascular: Negative.  Negative for chest pain, palpitations and leg swelling.  Gastrointestinal: Negative.   Endocrine: Negative for polydipsia, polyphagia and polyuria.  Neurological: Negative.  Psychiatric/Behavioral:  The patient has insomnia.     Today's Vitals   10/30/20 1132  BP: 110/60  Pulse: 85  Temp: 98.1 F (36.7 C)  TempSrc: Oral  Weight: 196 lb 8 oz (89.1 kg)  Height: 5' 2.2" (1.58 m)   Body mass index is 35.71 kg/m.  Wt Readings from Last 3 Encounters:  10/30/20 196 lb 8 oz (89.1 kg)  08/24/20 198 lb 6.4 oz (90 kg)  05/24/20 202 lb 6.4 oz (91.8 kg)    Objective:  Physical Exam Vitals reviewed.  Constitutional:      General: She is not in acute distress.    Appearance: Normal appearance. She is obese.  Cardiovascular:     Rate and Rhythm: Normal rate and regular rhythm.     Pulses: Normal pulses.     Heart sounds: Normal heart sounds. No murmur heard. Pulmonary:      Effort: Pulmonary effort is normal. No respiratory distress.     Breath sounds: Normal breath sounds. No wheezing.  Skin:    Capillary Refill: Capillary refill takes less than 2 seconds.  Neurological:     General: No focal deficit present.     Mental Status: She is alert and oriented to person, place, and time.     Cranial Nerves: No cranial nerve deficit.     Motor: No weakness.  Psychiatric:        Mood and Affect: Mood normal.        Behavior: Behavior normal.        Thought Content: Thought content normal.        Judgment: Judgment normal.        Assessment And Plan:     1. Abnormal glucose Comments: Unable to tolerate Rybelsus, will try her on Ozempic. Sample given in office. - Semaglutide,0.25 or 0.5MG /DOS, (OZEMPIC, 0.25 OR 0.5 MG/DOSE,) 2 MG/1.5ML SOPN; Inject 0.5 mg into the skin once a week.  Dispense: 4.5 mL; Refill: 1  2. Class 2 obesity due to excess calories without serious comorbidity with body mass index (BMI) of 35.0 to 35.9 in adult Comments: Has had 2 lb weight loss since last visit. Encouraged to try quick healthy meals such as rotissiere chicken, sandwich meat.   She is encouraged to strive for BMI less than 30 to decrease cardiac risk. Advised to aim for at least 150 minutes of exercise per week.   Patient was given opportunity to ask questions. Patient verbalized understanding of the plan and was able to repeat key elements of the plan. All questions were answered to their satisfaction.  Minette Brine, FNP   I, Minette Brine, FNP, have reviewed all documentation for this visit. The documentation on 10/30/20 for the exam, diagnosis, procedures, and orders are all accurate and complete.   IF YOU HAVE BEEN REFERRED TO A SPECIALIST, IT MAY TAKE 1-2 WEEKS TO SCHEDULE/PROCESS THE REFERRAL. IF YOU HAVE NOT HEARD FROM US/SPECIALIST IN TWO WEEKS, PLEASE GIVE [place] A CALL AT [phone].   THE PATIENT IS ENCOURAGED TO PRACTICE SOCIAL DISTANCING DUE TO THE COVID-19  PANDEMIC.

## 2020-11-07 ENCOUNTER — Other Ambulatory Visit (HOSPITAL_COMMUNITY): Payer: Self-pay

## 2020-11-13 ENCOUNTER — Other Ambulatory Visit (HOSPITAL_COMMUNITY): Payer: Self-pay

## 2020-12-01 ENCOUNTER — Other Ambulatory Visit (HOSPITAL_COMMUNITY): Payer: Self-pay

## 2020-12-22 ENCOUNTER — Other Ambulatory Visit (HOSPITAL_COMMUNITY): Payer: Self-pay

## 2021-01-01 ENCOUNTER — Other Ambulatory Visit: Payer: Self-pay

## 2021-01-01 ENCOUNTER — Other Ambulatory Visit (HOSPITAL_COMMUNITY): Payer: Self-pay

## 2021-01-01 ENCOUNTER — Ambulatory Visit (INDEPENDENT_AMBULATORY_CARE_PROVIDER_SITE_OTHER): Payer: Self-pay | Admitting: Nurse Practitioner

## 2021-01-01 ENCOUNTER — Encounter: Payer: Self-pay | Admitting: Nurse Practitioner

## 2021-01-01 VITALS — BP 116/80 | HR 86 | Temp 98.7°F | Ht 62.2 in | Wt 187.2 lb

## 2021-01-01 DIAGNOSIS — Z6834 Body mass index (BMI) 34.0-34.9, adult: Secondary | ICD-10-CM

## 2021-01-01 DIAGNOSIS — E6609 Other obesity due to excess calories: Secondary | ICD-10-CM

## 2021-01-01 DIAGNOSIS — R7303 Prediabetes: Secondary | ICD-10-CM

## 2021-01-01 DIAGNOSIS — F5101 Primary insomnia: Secondary | ICD-10-CM

## 2021-01-01 DIAGNOSIS — Z23 Encounter for immunization: Secondary | ICD-10-CM

## 2021-01-01 MED ORDER — PHENTERMINE HCL 37.5 MG PO CAPS
37.5000 mg | ORAL_CAPSULE | ORAL | 1 refills | Status: DC
  Filled 2021-01-01: qty 30, 30d supply, fill #0

## 2021-01-01 MED ORDER — ESZOPICLONE 3 MG PO TABS
3.0000 mg | ORAL_TABLET | Freq: Every evening | ORAL | 0 refills | Status: DC | PRN
  Filled 2021-01-01: qty 30, 30d supply, fill #0

## 2021-01-01 NOTE — Progress Notes (Signed)
I,Katawbba Wiggins,acting as a Education administrator for [employer], FNP.,have documented all relevant documentation on the behalf of Minette Brine, FNP,as directed by  Minette Brine, FNP while in the presence of Minette Brine, [place].   This visit occurred during the SARS-CoV-2 public health emergency.  Safety protocols were in place, including screening questions prior to the visit, additional usage of staff PPE, and extensive cleaning of exam room while observing appropriate contact time as indicated for disinfecting solutions.  Subjective:     Patient ID: Kristen David , female    DOB: [birth year] , 40 y.o.   MRN: [id number]   Chief Complaint  Patient presents with   Obesity    HPI  The patient is here today for a follow-up on her weight.  She is also here for a medication check Ozempic and is doing well. She is trying to get exercise in   She has tried belsomra for sleep but was not effective.  She was to see the sleep provider in 2020 but then got pregnant and did not go. She is having problems with initiating sleep. She reports her brain does not shut off.     Past Medical History:  Diagnosis Date   Anxiety    Depression    Fibroid    Hidradenitis suppurativa    History of narcotic addiction ([place])    Suboxone maintenance treatment complicating pregnancy, antepartum (HCC)    Tachycardia    "bouts of tachycardia" up to 130-140s, sometimes at rest but usually with anxiety   Vaginal Pap smear, abnormal      Family History  Problem Relation Age of Onset   Diabetes Mother    Cancer Mother        Breast   Hypertension Father    Gout Father      Current Outpatient Medications:    buprenorphine (SUBUTEX) 8 MG SUBL SL tablet, Place 8 mg under the tongue daily., Disp: , Rfl:    citalopram (CELEXA) 20 MG tablet, Take 1 tablet (20 mg total) by mouth daily., Disp: 90 tablet, Rfl: 1   eszopiclone 3 MG TABS, Take 1 tablet (3 mg total) by mouth at bedtime as needed. Take immediately before  bedtime, Disp: 30 tablet, Rfl: 0   ibuprofen (ADVIL) 600 MG tablet, Take 1 tablet (600 mg total) by mouth every 6 (six) hours., Disp: 30 tablet, Rfl: 0   Semaglutide,0.25 or 0.5MG /DOS, (OZEMPIC, 0.25 OR 0.5 MG/DOSE,) 2 MG/1.5ML SOPN, Inject 0.5 mg into the skin once a week., Disp: 4.5 mL, Rfl: 1   phentermine 37.5 MG capsule, Take 1 capsule (37.5 mg total) by mouth every morning., Disp: 30 capsule, Rfl: 1   Allergies  Allergen Reactions   Tramadol Nausea And Vomiting     Review of Systems  Constitutional: Negative.   Respiratory: Negative.    Cardiovascular: Negative.   Gastrointestinal: Negative.   Psychiatric/Behavioral: Negative.    All other systems reviewed and are negative.   Today's Vitals   01/01/21 1025  BP: 116/80  Pulse: 86  Temp: 98.7 F (37.1 C)  Weight: 187 lb 3.2 oz (84.9 kg)  Height: 5' 2.2" (1.58 m)   Body mass index is 34.02 kg/m.  Wt Readings from Last 3 Encounters:  01/01/21 187 lb 3.2 oz (84.9 kg)  10/30/20 196 lb 8 oz (89.1 kg)  08/24/20 198 lb 6.4 oz (90 kg)    BP Readings from Last 3 Encounters:  01/01/21 116/80  10/30/20 110/60  08/24/20 124/80    Objective:  Physical Exam Vitals reviewed.  Constitutional:      General: She is not in acute distress.    Appearance: Normal appearance. She is obese.  Cardiovascular:     Rate and Rhythm: Normal rate and regular rhythm.  Pulmonary:     Effort: Pulmonary effort is normal. No respiratory distress.     Breath sounds: No wheezing.  Skin:    Capillary Refill: Capillary refill takes less than 2 seconds.  Neurological:     General: No focal deficit present.     Mental Status: She is alert and oriented to person, place, and time.     Cranial Nerves: No cranial nerve deficit.     Motor: No weakness.  Psychiatric:        Mood and Affect: Mood normal.        Behavior: Behavior normal.        Thought Content: Thought content normal.        Judgment: Judgment normal.        Assessment And Plan:      1. Prediabetes Comments: She is tolerating Ozempic well, continue current dose  2. Primary insomnia Comments: Will try her on Qviviq, sent to [facility] until then continue Lunesta. if not effective will refer back to sleep clinic.   3. Class 1 obesity due to excess calories without serious comorbidity with body mass index (BMI) of 34.0 to 34.9 in adult Comments: Continue phentermine Tolerating medications well Congratulated on 9 lb weight loss - phentermine 37.5 MG capsule; Take 1 capsule (37.5 mg total) by mouth every morning.  Dispense: 30 capsule; Refill: 1  4. Need for vaccination - Flu Vaccine QUAD 6+ mos PF IM (Fluarix Quad PF)  Patient was given opportunity to ask questions. Patient verbalized understanding of the plan and was able to repeat key elements of the plan. All questions were answered to their satisfaction.  Minette Brine, FNP   I, Minette Brine, FNP, have reviewed all documentation for this visit. The documentation on 01/01/21 for the exam, diagnosis, procedures, and orders are all accurate and complete.   IF YOU HAVE BEEN REFERRED TO A SPECIALIST, IT MAY TAKE 1-2 WEEKS TO SCHEDULE/PROCESS THE REFERRAL. IF YOU HAVE NOT HEARD FROM US/SPECIALIST IN TWO WEEKS, PLEASE GIVE [place] A CALL AT [phone].   THE PATIENT IS ENCOURAGED TO PRACTICE SOCIAL DISTANCING DUE TO THE COVID-19 PANDEMIC.

## 2021-01-01 NOTE — Patient Instructions (Signed)

## 2021-02-02 ENCOUNTER — Other Ambulatory Visit (HOSPITAL_COMMUNITY): Payer: Self-pay

## 2021-02-02 ENCOUNTER — Encounter: Payer: Self-pay | Admitting: Nurse Practitioner

## 2021-02-02 ENCOUNTER — Other Ambulatory Visit: Payer: Self-pay | Admitting: Nurse Practitioner

## 2021-02-05 ENCOUNTER — Other Ambulatory Visit: Payer: Self-pay | Admitting: Nurse Practitioner

## 2021-02-05 ENCOUNTER — Other Ambulatory Visit (HOSPITAL_COMMUNITY): Payer: Self-pay

## 2021-02-05 ENCOUNTER — Encounter: Payer: Self-pay | Admitting: Nurse Practitioner

## 2021-02-06 ENCOUNTER — Other Ambulatory Visit (HOSPITAL_COMMUNITY): Payer: Self-pay

## 2021-02-06 MED ORDER — ESZOPICLONE 3 MG PO TABS
3.0000 mg | ORAL_TABLET | Freq: Every evening | ORAL | 0 refills | Status: DC | PRN
Start: 2021-02-06 — End: 2021-03-12
  Filled 2021-02-06: qty 30, 30d supply, fill #0

## 2021-03-06 ENCOUNTER — Other Ambulatory Visit (HOSPITAL_COMMUNITY): Payer: Self-pay

## 2021-03-06 ENCOUNTER — Encounter: Payer: Self-pay | Admitting: Nurse Practitioner

## 2021-03-06 ENCOUNTER — Ambulatory Visit (INDEPENDENT_AMBULATORY_CARE_PROVIDER_SITE_OTHER): Payer: Self-pay | Admitting: Nurse Practitioner

## 2021-03-06 ENCOUNTER — Other Ambulatory Visit: Payer: Self-pay

## 2021-03-06 VITALS — BP 104/78 | HR 73 | Temp 98.3°F | Ht 62.0 in | Wt 177.4 lb

## 2021-03-06 DIAGNOSIS — Z6832 Body mass index (BMI) 32.0-32.9, adult: Secondary | ICD-10-CM

## 2021-03-06 DIAGNOSIS — R7309 Other abnormal glucose: Secondary | ICD-10-CM

## 2021-03-06 DIAGNOSIS — E6609 Other obesity due to excess calories: Secondary | ICD-10-CM

## 2021-03-06 DIAGNOSIS — Z1159 Encounter for screening for other viral diseases: Secondary | ICD-10-CM

## 2021-03-06 MED ORDER — OZEMPIC (1 MG/DOSE) 4 MG/3ML ~~LOC~~ SOPN
1.0000 mg | PEN_INJECTOR | SUBCUTANEOUS | 1 refills | Status: DC
  Filled 2021-03-06 (×2): qty 9, 84d supply, fill #0
  Filled 2021-06-06 – 2021-06-14 (×2): qty 9, 84d supply, fill #1

## 2021-03-06 MED ORDER — PHENTERMINE HCL 37.5 MG PO CAPS
37.5000 mg | ORAL_CAPSULE | ORAL | 1 refills | Status: DC
  Filled 2021-03-06: qty 30, 30d supply, fill #0

## 2021-03-06 NOTE — Progress Notes (Signed)
I,Victoria T Hamilton,acting as a Education administrator for Minette Brine, FNP.,have documented all relevant documentation on the behalf of Minette Brine, FNP,as directed by  Minette Brine, FNP while in the presence of Minette Brine, [place].   This visit occurred during the SARS-CoV-2 public health emergency.  Safety protocols were in place, including screening questions prior to the visit, additional usage of staff PPE, and extensive cleaning of exam room while observing appropriate contact time as indicated for disinfecting solutions.  Subjective:     Patient ID: Kristen David , female    DOB: 1980-09-26 , 41 y.o.   MRN: [id number]   Chief Complaint  Patient presents with   Weight Check    HPI  Pt presents today for weight check. She has no concerns or questions at the moment. She feels like the Ozempic is not working as effectively, she is eating more of her proportions than before and be satisfied. She currently does not have insurance coverage.   Wt Readings from Last 3 Encounters: 03/06/21 : 177 lb 6.4 oz (80.5 kg) 01/01/21 : 187 lb 3.2 oz (84.9 kg) 10/30/20 : 196 lb 8 oz (89.1 kg)      Past Medical History:  Diagnosis Date   Anxiety    Depression    Fibroid    Hidradenitis suppurativa    History of narcotic addiction ([place])    Suboxone maintenance treatment complicating pregnancy, antepartum ([place])    Tachycardia    "bouts of tachycardia" up to 130-140s, sometimes at rest but usually with anxiety   Vaginal Pap smear, abnormal      Family History  Problem Relation Age of Onset   Diabetes Mother    Cancer Mother        Breast   Hypertension Father    Gout Father      Current Outpatient Medications:    buprenorphine (SUBUTEX) 8 MG SUBL SL tablet, Place 8 mg under the tongue daily., Disp: , Rfl:    ibuprofen (ADVIL) 600 MG tablet, Take 1 tablet (600 mg total) by mouth every 6 (six) hours., Disp: 30 tablet, Rfl: 0   Semaglutide, 1 MG/DOSE, (OZEMPIC, 1 MG/DOSE,) 4 MG/3ML SOPN, Inject  1 mg into the skin once a week., Disp: 9 mL, Rfl: 1   buprenorphine-naloxone (SUBOXONE) 8-2 mg SUBL SL tablet, Place 1 tablet under the tongue daily., Disp: 12 tablet, Rfl: 0   citalopram (CELEXA) 20 MG tablet, Take 1 tablet (20 mg total) by mouth daily., Disp: 90 tablet, Rfl: 1   eszopiclone 3 MG TABS, Take 1 tablet (3 mg total) by mouth at bedtime as needed. Take immediately before bedtime, Disp: 30 tablet, Rfl: 3   phentermine 37.5 MG capsule, Take 1 capsule (37.5 mg total) by mouth every morning., Disp: 30 capsule, Rfl: 1   Allergies  Allergen Reactions   Tramadol Nausea And Vomiting     Review of Systems  Constitutional: Negative.   Respiratory: Negative.    Cardiovascular: Negative.   Neurological: Negative.   Psychiatric/Behavioral: Negative.      Today's Vitals   03/06/21 1106  BP: 104/78  Pulse: 73  Temp: 98.3 F (36.8 C)  Weight: 177 lb 6.4 oz (80.5 kg)  Height: 5' 2"  (1.575 m)   Body mass index is 32.45 kg/m.  Wt Readings from Last 3 Encounters:  03/06/21 177 lb 6.4 oz (80.5 kg)  01/01/21 187 lb 3.2 oz (84.9 kg)  10/30/20 196 lb 8 oz (89.1 kg)    Objective:  Physical Exam Vitals  reviewed.  Constitutional:      General: She is not in acute distress.    Appearance: Normal appearance. She is obese.  Cardiovascular:     Rate and Rhythm: Normal rate and regular rhythm.     Pulses: Normal pulses.     Heart sounds: Normal heart sounds. No murmur heard. Pulmonary:     Effort: Pulmonary effort is normal. No respiratory distress.     Breath sounds: No wheezing.  Skin:    Capillary Refill: Capillary refill takes less than 2 seconds.  Neurological:     General: No focal deficit present.     Mental Status: She is alert and oriented to person, place, and time.     Cranial Nerves: No cranial nerve deficit.     Motor: No weakness.  Psychiatric:        Mood and Affect: Mood normal.        Behavior: Behavior normal.        Thought Content: Thought content normal.         Judgment: Judgment normal.        Assessment And Plan:     1. Class 1 obesity due to excess calories without serious comorbidity with body mass index (BMI) of 32.0 to 32.9 in adult Comments: Weight is down 10 lbs since last visit, continue phentermine tolerating well.  - phentermine 37.5 MG capsule; Take 1 capsule (37.5 mg total) by mouth every morning.  Dispense: 30 capsule; Refill: 1  2. Abnormal glucose Comments: HgbA1c was stable at last visit. Continue to focus on healthy diet low in sugar and carbohydrates. Encouraged to increase physical activity.  She is encouraged to strive for BMI less than 30 to decrease cardiac risk. Advised to aim for at least 150 minutes of exercise per week.  - Hemoglobin A1c - BMP8+eGFR - Semaglutide, 1 MG/DOSE, (OZEMPIC, 1 MG/DOSE,) 4 MG/3ML SOPN; Inject 1 mg into the skin once a week.  Dispense: 9 mL; Refill: 1  3. Encounter for hepatitis C screening test for low risk patient Will check Hepatitis C screening due to recent recommendations to screen all adults 18 years and older - Hepatitis C antibody    Patient was given opportunity to ask questions. Patient verbalized understanding of the plan and was able to repeat key elements of the plan. All questions were answered to their satisfaction.  Minette Brine, FNP   I, Minette Brine, FNP, have reviewed all documentation for this visit. The documentation on 01/3//23 for the exam, diagnosis, procedures, and orders are all accurate and complete.   IF YOU HAVE BEEN REFERRED TO A SPECIALIST, IT MAY TAKE 1-2 WEEKS TO SCHEDULE/PROCESS THE REFERRAL. IF YOU HAVE NOT HEARD FROM US/SPECIALIST IN TWO WEEKS, PLEASE GIVE [place] A CALL AT [phone].   THE PATIENT IS ENCOURAGED TO PRACTICE SOCIAL DISTANCING DUE TO THE COVID-19 PANDEMIC.

## 2021-03-06 NOTE — Patient Instructions (Signed)

## 2021-03-07 LAB — BMP8+EGFR
BUN/Creatinine Ratio: 9 (ref 9–23)
BUN: 8 mg/dL (ref 6–24)
CO2: 23 mmol/L (ref 20–29)
Calcium: 9.2 mg/dL (ref 8.7–10.2)
Chloride: 99 mmol/L (ref 96–106)
Creatinine, Ser: 0.86 mg/dL (ref 0.57–1.00)
Glucose: 75 mg/dL (ref 70–99)
Potassium: 4.2 mmol/L (ref 3.5–5.2)
Sodium: 141 mmol/L (ref 134–144)
eGFR: 88 mL/min/{1.73_m2} (ref >59)

## 2021-03-07 LAB — HEMOGLOBIN A1C
Est. average glucose Bld gHb Est-mCnc: 117 mg/dL
Hgb A1c MFr Bld: 5.7 % — ABNORMAL HIGH (ref 4.8–5.6)

## 2021-03-07 LAB — HEPATITIS C ANTIBODY: Hep C Virus Ab: 0.1 s/co ratio (ref 0.0–0.9)

## 2021-03-08 ENCOUNTER — Other Ambulatory Visit (HOSPITAL_COMMUNITY): Payer: Self-pay

## 2021-03-12 ENCOUNTER — Other Ambulatory Visit: Payer: Self-pay | Admitting: Nurse Practitioner

## 2021-03-12 ENCOUNTER — Encounter: Payer: Self-pay | Admitting: Nurse Practitioner

## 2021-03-12 ENCOUNTER — Other Ambulatory Visit (HOSPITAL_COMMUNITY): Payer: Self-pay

## 2021-03-12 DIAGNOSIS — F32A Depression, unspecified: Secondary | ICD-10-CM

## 2021-03-12 DIAGNOSIS — G4700 Insomnia, unspecified: Secondary | ICD-10-CM

## 2021-03-12 MED ORDER — ESZOPICLONE 3 MG PO TABS
3.0000 mg | ORAL_TABLET | Freq: Every evening | ORAL | 3 refills | Status: DC | PRN
  Filled 2021-03-12: qty 30, 30d supply, fill #0
  Filled 2021-04-11: qty 30, 30d supply, fill #1
  Filled 2021-05-15: qty 30, 30d supply, fill #2
  Filled 2021-06-15: qty 30, 30d supply, fill #3

## 2021-03-12 MED ORDER — CITALOPRAM HYDROBROMIDE 20 MG PO TABS
20.0000 mg | ORAL_TABLET | Freq: Every day | ORAL | 1 refills | Status: DC
  Filled 2021-03-12: qty 90, 90d supply, fill #0
  Filled 2021-06-06: qty 90, 90d supply, fill #1

## 2021-03-14 ENCOUNTER — Other Ambulatory Visit (HOSPITAL_COMMUNITY): Payer: Self-pay

## 2021-03-14 MED ORDER — BUPRENORPHINE HCL-NALOXONE HCL 8-2 MG SL SUBL
1.0000 | SUBLINGUAL_TABLET | Freq: Every day | SUBLINGUAL | 0 refills | Status: DC
Start: 2021-03-14 — End: 2021-06-06
  Filled 2021-03-14 (×2): qty 12, 12d supply, fill #0

## 2021-04-11 ENCOUNTER — Other Ambulatory Visit (HOSPITAL_COMMUNITY): Payer: Self-pay

## 2021-05-09 ENCOUNTER — Ambulatory Visit: Payer: Self-pay | Admitting: Nurse Practitioner

## 2021-05-15 ENCOUNTER — Other Ambulatory Visit (HOSPITAL_COMMUNITY): Payer: Self-pay

## 2021-05-17 IMAGING — US US MFM FETAL BPP W/O NON-STRESS
1 series · 12 of 25 positions shown · non-contrast
Comparison: none

[Series 1: us mfm fetal bpp w/o non-stress · 25 acquisitions, 12 frames shown]
[im 2/25]
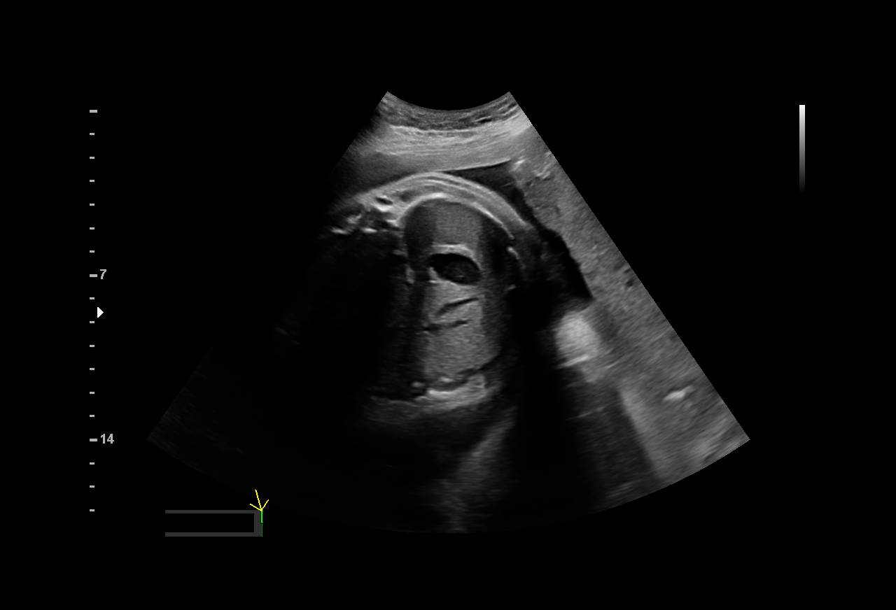
[im 4/25]
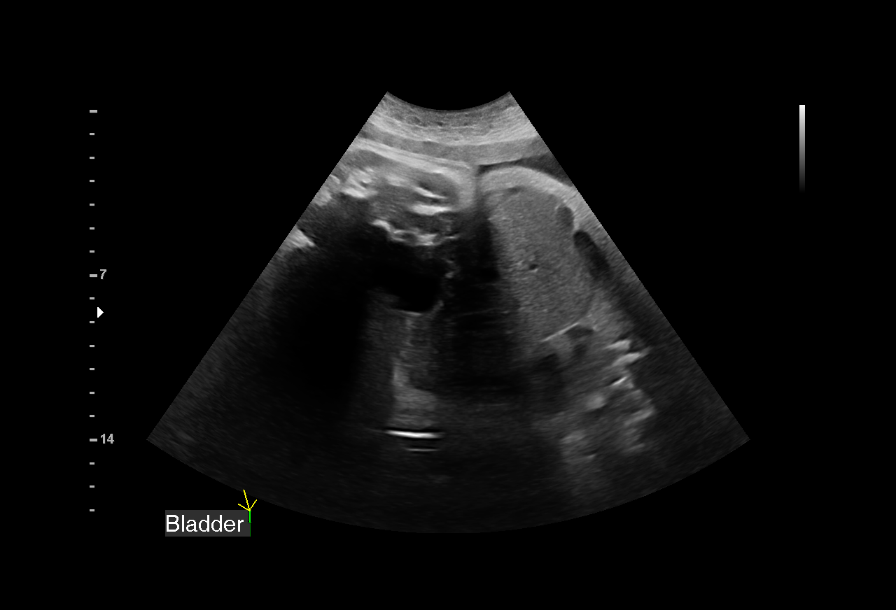
[im 6/25]
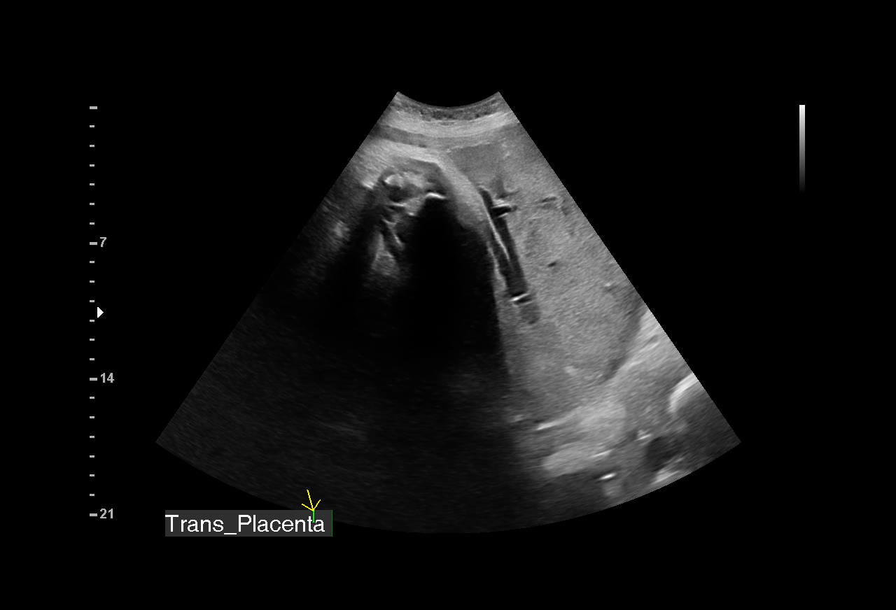
[im 8/25]
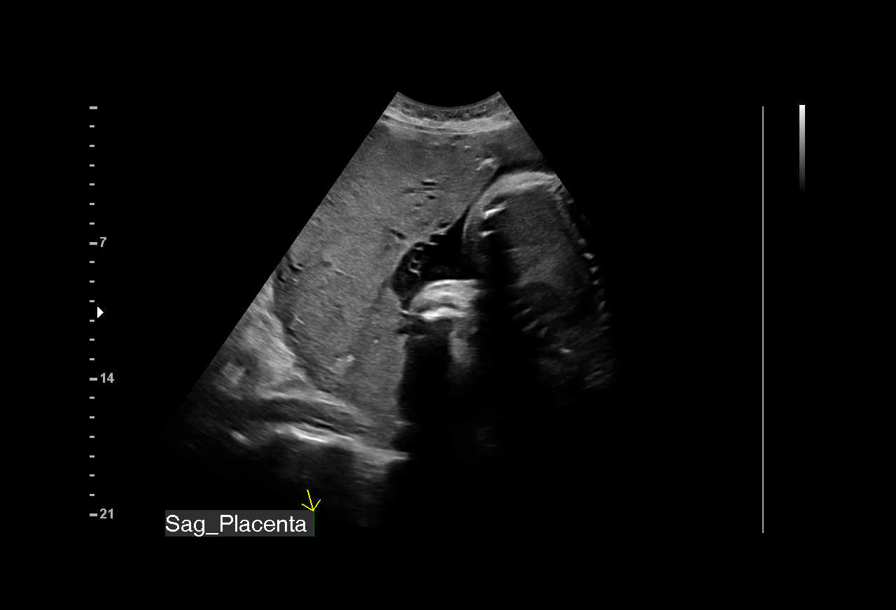
[im 10/25]
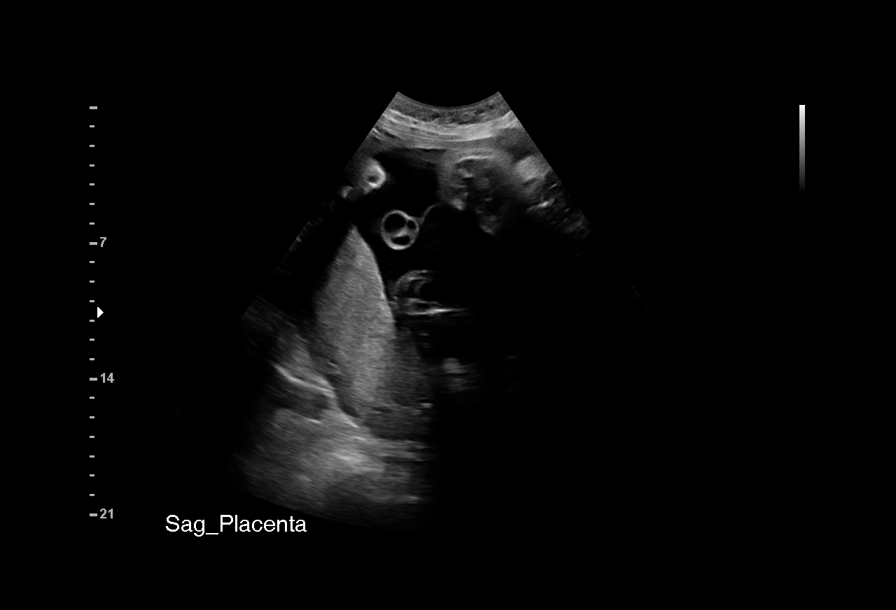
[im 12/25]
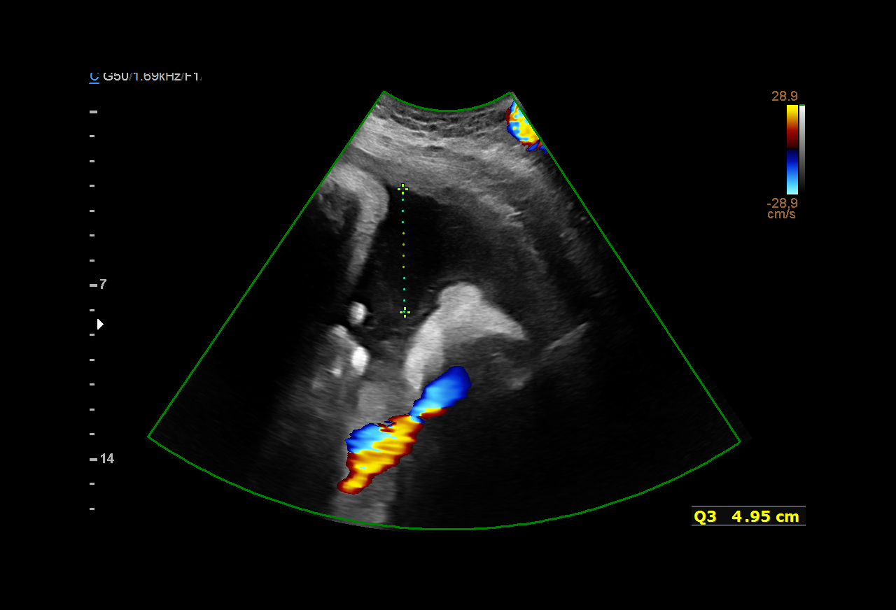
[im 14/25]
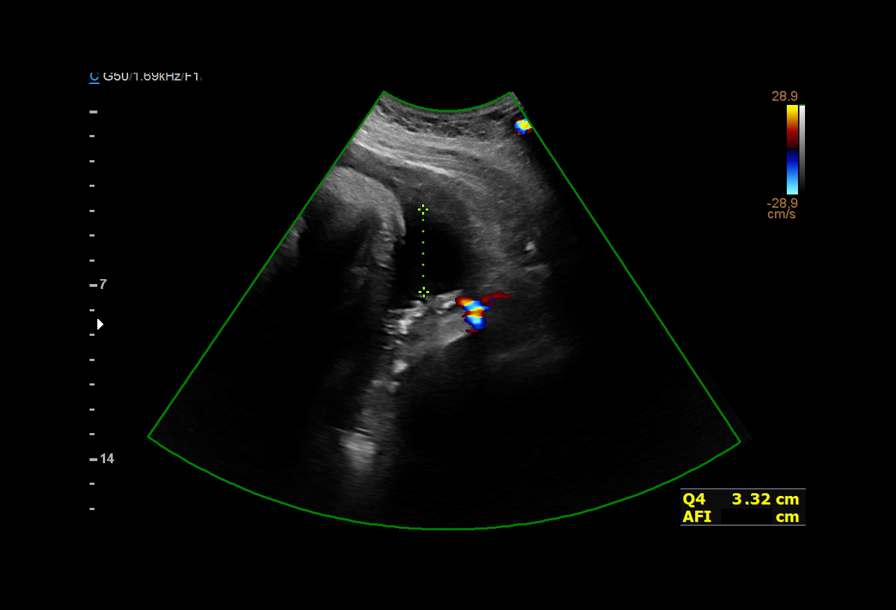
[im 16/25]
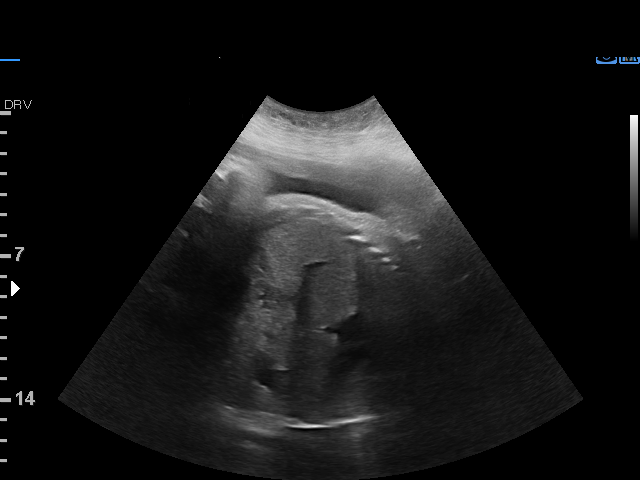
[im 18/25]
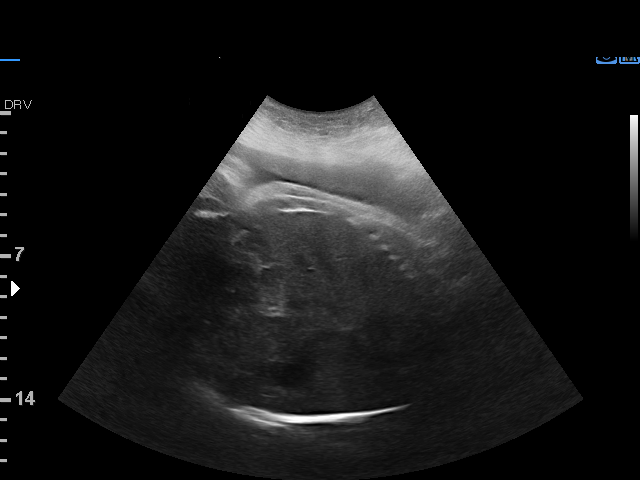
[im 20/25]
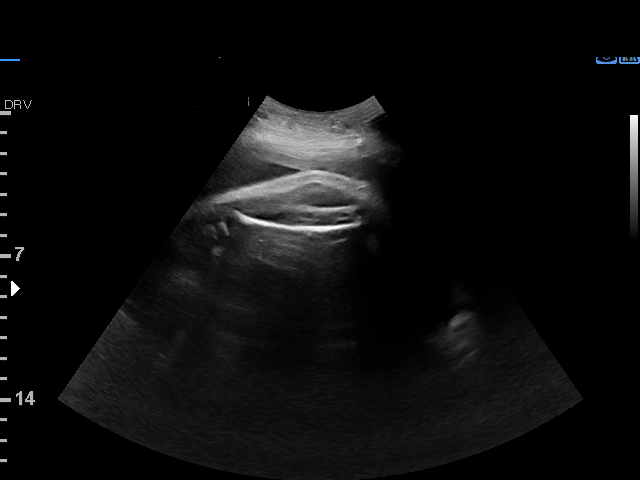
[im 22/25]
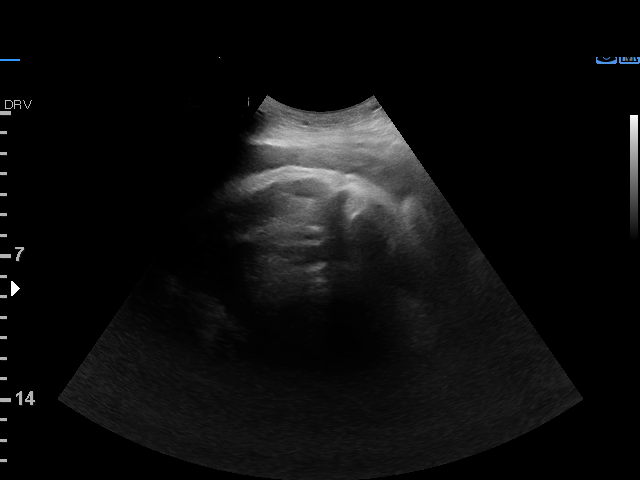
[im 24/25]
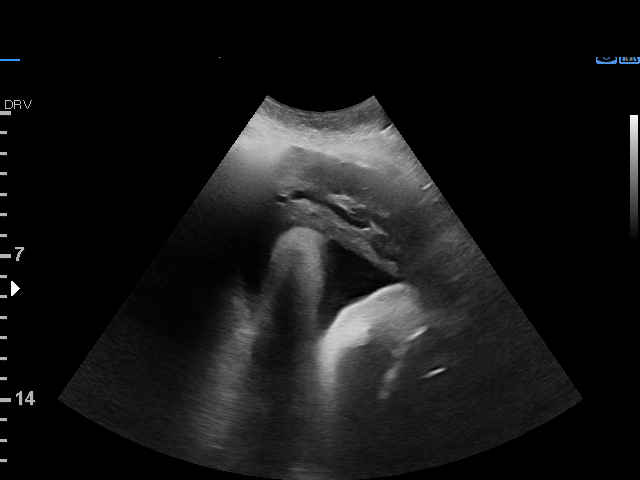

[12 of 25 positions shown; findings below may reference images not displayed]

Obstetrics &
                                                            Gynecology
                                                            [address]
                                                            [place].
                   CNM

Indications

 Abnormal finding on antenatal screening
 ([DATE] BPP in office)
 38 weeks gestation of pregnancy
 Advanced maternal age primigravida 35+,
 third trimester
 Uterine fibroids affecting pregnancy in third  O34.13,
 trimester, antepartum
 Pregnancy comlicated by subutex                O99.320, F11.20,
 maintenance, antepartum
Fetal Evaluation

 Num Of Fetuses:         1
 Fetal Heart Rate(bpm):  133
 Cardiac Activity:       Observed
 Presentation:           Cephalic
 Placenta:               Posterior

 Amniotic Fluid
 AFI FV:      Within normal limits

 AFI Sum(cm)     %Tile       Largest Pocket(cm)
 15.8            63          5

 RUQ(cm)       RLQ(cm)       LUQ(cm)        LLQ(cm)
 3.8           3.3           3.7            5
Biophysical Evaluation

 Amniotic F.V:   Within normal limits       F. Tone:        Observed
 F. Movement:    Observed                   Score:          [DATE]
 F. Breathing:   Observed
OB History

 Gravidity:    3         Term:   1        Prem:   0        SAB:   0
 TOP:          1       Ectopic:  0        Living: 1
Gestational Age

 LMP:           38w 5d        Date:  12/05/18                 EDD:   09/11/19
 Best:          38w 5d     Det. By:  LMP  (12/05/18)          EDD:   09/11/19
Cervix Uterus Adnexa

 Cervix
 Not visualized (advanced GA >86wks)
Impression

 Ultrasound requested for BPP.  Patient had biophysical
 profile at your office that was reported as [DATE].  She is
 on Subutex maintenance.
 Amniotic fluid is normal and good fetal activity is seen
 .Antenatal testing is reassuring. BPP [DATE].

 Antenatal testings have limitations in predicting fetal
 compromise.  If NST is reactive, consider delivery at 39
 weeks gestation.  If NST is not reactive, consider delivery now
                 Macor, Stanika

## 2021-06-06 ENCOUNTER — Other Ambulatory Visit (HOSPITAL_COMMUNITY): Payer: Self-pay

## 2021-06-06 MED ORDER — BUPRENORPHINE HCL-NALOXONE HCL 8-2 MG SL SUBL
1.0000 | SUBLINGUAL_TABLET | Freq: Every day | SUBLINGUAL | 0 refills | Status: DC
  Filled 2021-06-06: qty 30, 30d supply, fill #0

## 2021-06-06 MED ORDER — BUPRENORPHINE HCL-NALOXONE HCL 8-2 MG SL SUBL: SUBLINGUAL_TABLET | SUBLINGUAL | 0 refills | Status: DC

## 2021-06-14 ENCOUNTER — Other Ambulatory Visit (HOSPITAL_COMMUNITY): Payer: Self-pay

## 2021-06-15 ENCOUNTER — Other Ambulatory Visit (HOSPITAL_COMMUNITY): Payer: Self-pay

## 2021-07-04 ENCOUNTER — Other Ambulatory Visit (HOSPITAL_COMMUNITY): Payer: Self-pay

## 2021-07-04 MED ORDER — BUPRENORPHINE HCL-NALOXONE HCL 8-2 MG SL SUBL
1.0000 | SUBLINGUAL_TABLET | Freq: Every day | SUBLINGUAL | 0 refills | Status: DC
  Filled 2021-07-04 (×2): qty 6, 6d supply, fill #0

## 2021-07-05 ENCOUNTER — Other Ambulatory Visit (HOSPITAL_COMMUNITY): Payer: Self-pay

## 2021-07-05 MED ORDER — BUPRENORPHINE HCL-NALOXONE HCL 8-2 MG SL SUBL
SUBLINGUAL_TABLET | SUBLINGUAL | 0 refills | Status: DC
  Filled 2021-07-05 – 2021-07-12 (×2): qty 6, 6d supply, fill #0

## 2021-07-06 ENCOUNTER — Other Ambulatory Visit (HOSPITAL_COMMUNITY): Payer: Self-pay

## 2021-07-12 ENCOUNTER — Other Ambulatory Visit (HOSPITAL_COMMUNITY): Payer: Self-pay

## 2021-07-17 ENCOUNTER — Other Ambulatory Visit (HOSPITAL_COMMUNITY): Payer: Self-pay

## 2021-07-17 MED ORDER — BUPRENORPHINE HCL-NALOXONE HCL 8-2 MG SL SUBL
1.0000 | SUBLINGUAL_TABLET | Freq: Every day | SUBLINGUAL | 0 refills | Status: DC
  Filled 2021-07-17: qty 30, 30d supply, fill #0

## 2021-07-20 ENCOUNTER — Encounter: Payer: Self-pay | Admitting: Nurse Practitioner

## 2021-07-26 ENCOUNTER — Other Ambulatory Visit (HOSPITAL_COMMUNITY): Payer: Self-pay

## 2021-07-26 ENCOUNTER — Ambulatory Visit (INDEPENDENT_AMBULATORY_CARE_PROVIDER_SITE_OTHER): Payer: Self-pay | Admitting: Nurse Practitioner

## 2021-07-26 VITALS — BP 122/60 | HR 80 | Temp 99.2°F | Ht 62.0 in | Wt 168.0 lb

## 2021-07-26 DIAGNOSIS — F32A Depression, unspecified: Secondary | ICD-10-CM

## 2021-07-26 DIAGNOSIS — E6609 Other obesity due to excess calories: Secondary | ICD-10-CM

## 2021-07-26 DIAGNOSIS — G4700 Insomnia, unspecified: Secondary | ICD-10-CM

## 2021-07-26 DIAGNOSIS — Z01419 Encounter for gynecological examination (general) (routine) without abnormal findings: Secondary | ICD-10-CM

## 2021-07-26 DIAGNOSIS — R7303 Prediabetes: Secondary | ICD-10-CM

## 2021-07-26 DIAGNOSIS — Z6830 Body mass index (BMI) 30.0-30.9, adult: Secondary | ICD-10-CM

## 2021-07-26 DIAGNOSIS — F3342 Major depressive disorder, recurrent, in full remission: Secondary | ICD-10-CM

## 2021-07-26 DIAGNOSIS — F5101 Primary insomnia: Secondary | ICD-10-CM

## 2021-07-26 MED ORDER — SAXENDA 18 MG/3ML ~~LOC~~ SOPN
3.0000 mg | PEN_INJECTOR | Freq: Every day | SUBCUTANEOUS | 1 refills | Status: DC
  Filled 2021-07-26 – 2022-01-29 (×4): qty 15, 30d supply, fill #0

## 2021-07-26 MED ORDER — PHENTERMINE HCL 37.5 MG PO CAPS
37.5000 mg | ORAL_CAPSULE | ORAL | 1 refills | Status: DC
  Filled 2021-07-26: qty 30, 30d supply, fill #0
  Filled 2021-11-29: qty 30, 30d supply, fill #1

## 2021-07-26 MED ORDER — ESZOPICLONE 3 MG PO TABS
3.0000 mg | ORAL_TABLET | Freq: Every evening | ORAL | 3 refills | Status: DC | PRN
  Filled 2021-07-26: qty 30, 30d supply, fill #0
  Filled 2021-08-24: qty 30, 30d supply, fill #1
  Filled 2021-09-28: qty 30, 30d supply, fill #2
  Filled 2021-10-26: qty 30, 30d supply, fill #3

## 2021-07-26 MED ORDER — CITALOPRAM HYDROBROMIDE 20 MG PO TABS
20.0000 mg | ORAL_TABLET | Freq: Every day | ORAL | 1 refills | Status: DC
  Filled 2021-07-26: qty 90, 90d supply, fill #0

## 2021-07-26 NOTE — Patient Instructions (Signed)
Preventing Type 2 Diabetes Mellitus Type 2 diabetes, also called type 2 diabetes mellitus, is a long-term (chronic) disease that affects sugar (glucose) levels in your blood. Normally, a hormone called insulin allows glucose to enter cells in your body. The cells use glucose for energy. With type 2 diabetes, you will have one or both of these problems: Your pancreas does not make enough insulin. Cells in your body do not respond properly to insulin that your body makes (insulin resistance). Insulin resistance or lack of insulin causes extra glucose to build up in the blood instead of going into cells. As a result, high blood glucose (hyperglycemia) develops. That can cause many complications. Being overweight or obese and having an inactive (sedentary) lifestyle can increase your risk for diabetes. Type 2 diabetes can be delayed or prevented by making certain nutrition and lifestyle changes. How can this condition affect me? If you do not take steps to prevent diabetes, your blood glucose levels may keep increasing over time. Too much glucose in your blood for a long time can damage your blood vessels, heart, kidneys, nerves, and eyes. Type 2 diabetes can lead to chronic health problems and complications, such as: Heart disease. Stroke. Blindness. Kidney disease. Depression. Poor circulation in your feet and legs. In severe cases, a foot or leg may need to be surgically removed (amputated). What can increase my risk? You may be more likely to develop type 2 diabetes if you: Have type 2 diabetes in your family. Are overweight or obese. Have a sedentary lifestyle. Have insulin resistance or a history of prediabetes. Have a history of pregnancy-related (gestational) diabetes or polycystic ovary syndrome (PCOS). What actions can I take to prevent this? It can be difficult to recognize signs of type 2 diabetes. Taking action to prevent the disease before you develop symptoms is the best way to avoid  possible damage to your body. Making certain nutrition and lifestyle changes may prevent or delay the disease and related health problems. Nutrition  Eat healthy meals and snacks regularly. Do not skip meals. Fruit or a handful of nuts is a healthy snack between meals. Drink water throughout the day. Avoid drinks that contain added sugar, such as soda or sweetened tea. Drink enough fluid to keep your urine pale yellow. Follow instructions from your health care provider about eating or drinking restrictions. Limit the amount of food you eat by: Managing how much you eat at a time (portion size). Checking food labels for the serving sizes of food. Using a kitchen scale to weigh amounts of food. Saut or steam food instead of frying it. Cook with water or broth instead of oils or butter. Limit saturated fat and salt (sodium) in your diet. Have no more than 1 tsp (2,400 mg) of sodium a day. If you have heart disease or high blood pressure, use less than ? tsp (1,500 mg) of sodium a day. Lifestyle  Lose weight if needed and as told. Your health care provider can determine how much weight loss is best for you and can help you lose weight safely. If you are overweight or obese, you may be told to lose at least 5?7% of your body weight. Manage blood pressure, cholesterol, and stress. Your health care provider will help determine the best treatment for you. Do not use any products that contain nicotine or tobacco. These products include cigarettes, chewing tobacco, and vaping devices, such as e-cigarettes. If you need help quitting, ask your health care provider. Activity  Do physical   activity that makes your heart beat faster and makes you sweat (moderate intensity). Do this for at least 30 minutes on at least 5 days of the week, or as much as told by your health care provider. Ask your health care provider what activities are safe for you. A mix of activities may be best, such as walking, swimming,  cycling, and strength training. Try to add physical activity into your day. For example: Park your car farther away than usual so that you walk more. Take a walk during your lunch break. Use stairs instead of elevators or escalators. Walk or bike to work instead of driving. Alcohol use If you drink alcohol: Limit how much you have to: 0?1 drink a day for women who are not pregnant. 0?2 drinks a day for men. Know how much alcohol is in your drink. In the [place] one drink equals one 12 oz bottle of beer (355 mL), one 5 oz glass of wine (148 mL), or one 1 oz glass of hard liquor (44 mL). General information Talk with your health care provider about your risk factors and how you can reduce your risk for diabetes. Have your blood glucose tested regularly, as told by your health care provider. Get screening tests as told by your health care provider. You may have these regularly, especially if you have certain risk factors for type 2 diabetes. Make an appointment with a registered dietitian. This diet and nutrition specialist can help you make a healthy eating plan and help you understand portion sizes and food labels. Where to find support Ask your health care provider to recommend a registered dietitian, a certified diabetes care and education specialist, or a weight loss program. Look for local or online weight loss groups. Join a gym, fitness club, or outdoor activity group, such as a walking club. Where to find more information For help and guidance and to learn more about diabetes and diabetes prevention, visit: American Diabetes Association (ADA): www.diabetes.org National Institute of Diabetes and Digestive and Kidney Diseases: www.niddk.nih.gov To learn more about healthy eating, visit: U.S. Department of Agriculture (USDA): www.choosemyplate.gov Office of Disease Prevention and Health Promotion (ODPHP): health.gov Summary You can delay or prevent type 2 diabetes by eating healthy  foods, losing weight if needed, and increasing your physical activity. Talk with your health care provider about your risk factors for type 2 diabetes and how you can reduce your risk. It can be difficult to recognize the signs of type 2 diabetes. The best way to avoid possible damage to your body is to take action to prevent the disease before you develop symptoms. Get screening tests as told by your health care provider. This information is not intended to replace advice given to you by your health care provider. Make sure you discuss any questions you have with your health care provider. Document Revised: 04/17/2020 Document Reviewed: 04/17/2020 Elsevier Patient Education  2023 Elsevier Inc.  

## 2021-07-26 NOTE — Progress Notes (Unsigned)
I,Mann Skaggs,acting as a Education administrator for [employer], FNP.,have documented all relevant documentation on the behalf of Minette Brine, FNP,as directed by  Minette Brine, FNP while in the presence of Minette Brine, [address].  This visit occurred during the SARS-CoV-2 public health emergency.  Safety protocols were in place, including screening questions prior to the visit, additional usage of staff PPE, and extensive cleaning of exam room while observing appropriate contact time as indicated for disinfecting solutions.  Subjective:     Patient ID: Kristen David , female    DOB: May 20, 1980 , 41 y.o.   MRN: [id number]   Chief Complaint  Patient presents with   Prediabetes    HPI  Patient presents today for follow up. She has restarted working. Her medications are covered.  She is going to [facility] for her Suboxone and is deescalating her dose. She had been exercising prior to not having her medications.    Wt Readings from Last 3 Encounters: 07/26/21 : 168 lb (76.2 kg) 03/06/21 : 177 lb 6.4 oz (80.5 kg) 01/01/21 : 187 lb 3.2 oz (84.9 kg)       Past Medical History:  Diagnosis Date   Anxiety    Depression    Fibroid    Hidradenitis suppurativa    History of narcotic addiction ([place])    Suboxone maintenance treatment complicating pregnancy, antepartum ([place])    Tachycardia    "bouts of tachycardia" up to 130-140s, sometimes at rest but usually with anxiety   Vaginal Pap smear, abnormal      Family History  Problem Relation Age of Onset   Diabetes Mother    Cancer Mother        Breast   Hypertension Father    Gout Father      Current Outpatient Medications:    buprenorphine (SUBUTEX) 8 MG SUBL SL tablet, Place 8 mg under the tongue daily., Disp: , Rfl:    eszopiclone 3 MG TABS, Take 1 tablet (3 mg total) by mouth at bedtime as needed. Take immediately before bedtime, Disp: 30 tablet, Rfl: 3   ibuprofen (ADVIL) 600 MG tablet, Take 1 tablet (600 mg total) by mouth every 6  (six) hours., Disp: 30 tablet, Rfl: 0   phentermine 37.5 MG capsule, Take 1 capsule (37.5 mg total) by mouth every morning., Disp: 30 capsule, Rfl: 1   buprenorphine-naloxone (SUBOXONE) 8-2 mg SUBL SL tablet, Place 1 tablet every day by sublingual route., Disp: 30 tablet, Rfl: 0   buprenorphine-naloxone (SUBOXONE) 8-2 mg SUBL SL tablet, Place 1 tablet under the tongue daily., Disp: 30 tablet, Rfl: 0   buprenorphine-naloxone (SUBOXONE) 8-2 mg SUBL SL tablet, Place 1 tablet under the tongue daily., Disp: 6 tablet, Rfl: 0   buprenorphine-naloxone (SUBOXONE) 8-2 mg SUBL SL tablet, Place 1 tablet under the tongue once a day for 6 days, Disp: 6 tablet, Rfl: 0   buprenorphine-naloxone (SUBOXONE) 8-2 mg SUBL SL tablet, Place 1 tablet under the tongue daily., Disp: 30 tablet, Rfl: 0   citalopram (CELEXA) 20 MG tablet, Take 1 tablet (20 mg total) by mouth daily., Disp: 90 tablet, Rfl: 1   Allergies  Allergen Reactions   Tramadol Nausea And Vomiting     Review of Systems  Constitutional: Negative.   Respiratory: Negative.    Cardiovascular: Negative.   Gastrointestinal: Negative.   Neurological: Negative.      Today's Vitals   07/26/21 1104  BP: 122/60  Pulse: 80  Temp: 99.2 F (37.3 C)  TempSrc: Oral  Weight: 168 lb (76.2 kg)  Height: '5\' 2"'$  (1.575 m)   Body mass index is 30.73 kg/m.  Wt Readings from Last 3 Encounters:  07/26/21 168 lb (76.2 kg)  03/06/21 177 lb 6.4 oz (80.5 kg)  01/01/21 187 lb 3.2 oz (84.9 kg)    Objective:  Physical Exam      Assessment And Plan:     1. Prediabetes  2. Depression, unspecified depression type  3. Primary insomnia  4. Class 1 obesity due to excess calories without serious comorbidity with body mass index (BMI) of 32.0 to 32.9 in adult  She is encouraged to strive for BMI less than 30 to decrease cardiac risk. Advised to aim for at least 150 minutes of exercise per week.    Patient was given opportunity to ask questions. Patient  verbalized understanding of the plan and was able to repeat key elements of the plan. All questions were answered to their satisfaction.  Octavio Manns   I, Octavio Manns, have reviewed all documentation for this visit. The documentation on 07/26/21 for the exam, diagnosis, procedures, and orders are all accurate and complete.   IF YOU HAVE BEEN REFERRED TO A SPECIALIST, IT MAY TAKE 1-2 WEEKS TO SCHEDULE/PROCESS THE REFERRAL. IF YOU HAVE NOT HEARD FROM US/SPECIALIST IN TWO WEEKS, PLEASE GIVE [place] A CALL AT [phone].   THE PATIENT IS ENCOURAGED TO PRACTICE SOCIAL DISTANCING DUE TO THE COVID-19 PANDEMIC.

## 2021-07-30 ENCOUNTER — Encounter: Payer: Self-pay | Admitting: Nurse Practitioner

## 2021-08-14 ENCOUNTER — Other Ambulatory Visit (HOSPITAL_COMMUNITY): Payer: Self-pay

## 2021-08-14 MED ORDER — BUPRENORPHINE HCL-NALOXONE HCL 8-2 MG SL SUBL
1.0000 | SUBLINGUAL_TABLET | Freq: Every day | SUBLINGUAL | 0 refills | Status: DC
  Filled 2021-08-17: qty 30, 30d supply, fill #0

## 2021-08-14 MED ORDER — CITALOPRAM HYDROBROMIDE 20 MG PO TABS
40.0000 mg | ORAL_TABLET | Freq: Every day | ORAL | 2 refills | Status: DC
  Filled 2021-08-14: qty 180, 90d supply, fill #0
  Filled 2021-11-29: qty 180, 90d supply, fill #1

## 2021-08-17 ENCOUNTER — Other Ambulatory Visit (HOSPITAL_COMMUNITY): Payer: Self-pay

## 2021-08-24 ENCOUNTER — Other Ambulatory Visit (HOSPITAL_COMMUNITY): Payer: Self-pay

## 2021-08-29 ENCOUNTER — Other Ambulatory Visit (HOSPITAL_COMMUNITY): Payer: Self-pay

## 2021-09-11 ENCOUNTER — Other Ambulatory Visit (HOSPITAL_COMMUNITY): Payer: Self-pay

## 2021-09-11 MED ORDER — BUPRENORPHINE HCL-NALOXONE HCL 8-2 MG SL SUBL
1.0000 | SUBLINGUAL_TABLET | Freq: Every day | SUBLINGUAL | 0 refills | Status: AC
  Filled 2021-09-14: qty 14, 14d supply, fill #0

## 2021-09-14 ENCOUNTER — Other Ambulatory Visit (HOSPITAL_COMMUNITY): Payer: Self-pay

## 2021-09-27 ENCOUNTER — Other Ambulatory Visit (HOSPITAL_COMMUNITY): Payer: Self-pay

## 2021-09-27 MED ORDER — BUPRENORPHINE HCL-NALOXONE HCL 8-2 MG SL SUBL
1.0000 | SUBLINGUAL_TABLET | Freq: Every day | SUBLINGUAL | 0 refills | Status: DC
  Filled 2021-09-28: qty 28, 28d supply, fill #0

## 2021-09-28 ENCOUNTER — Other Ambulatory Visit (HOSPITAL_COMMUNITY): Payer: Self-pay

## 2021-10-25 ENCOUNTER — Other Ambulatory Visit (HOSPITAL_COMMUNITY): Payer: Self-pay

## 2021-10-25 MED ORDER — BUPRENORPHINE HCL-NALOXONE HCL 8-2 MG SL SUBL
1.0000 | SUBLINGUAL_TABLET | Freq: Every day | SUBLINGUAL | 0 refills | Status: DC
  Filled 2021-10-25: qty 10, 10d supply, fill #0

## 2021-10-26 ENCOUNTER — Other Ambulatory Visit (HOSPITAL_COMMUNITY): Payer: Self-pay

## 2021-11-06 ENCOUNTER — Other Ambulatory Visit (HOSPITAL_COMMUNITY): Payer: Self-pay

## 2021-11-06 MED ORDER — CLONIDINE HCL 0.1 MG PO TABS
0.1000 mg | ORAL_TABLET | Freq: Four times a day (QID) | ORAL | 0 refills | Status: DC | PRN
  Filled 2021-11-06: qty 15, 4d supply, fill #0

## 2021-11-06 MED ORDER — LOPERAMIDE HCL 2 MG PO CAPS
2.0000 mg | ORAL_CAPSULE | Freq: Four times a day (QID) | ORAL | 0 refills | Status: DC | PRN
  Filled 2021-11-06: qty 20, 5d supply, fill #0

## 2021-11-06 MED ORDER — HYDROXYZINE HCL 25 MG PO TABS
25.0000 mg | ORAL_TABLET | Freq: Four times a day (QID) | ORAL | 0 refills | Status: DC | PRN
  Filled 2021-11-06: qty 20, 5d supply, fill #0

## 2021-11-06 MED ORDER — ONDANSETRON 4 MG PO TBDP
4.0000 mg | ORAL_TABLET | Freq: Four times a day (QID) | ORAL | 0 refills | Status: AC | PRN
  Filled 2021-11-06 – 2022-03-01 (×2): qty 20, 5d supply, fill #0

## 2021-11-07 ENCOUNTER — Other Ambulatory Visit (HOSPITAL_COMMUNITY): Payer: Self-pay

## 2021-11-07 MED ORDER — BUPRENORPHINE HCL-NALOXONE HCL 8-2 MG SL SUBL
1.0000 | SUBLINGUAL_TABLET | Freq: Every day | SUBLINGUAL | 0 refills | Status: DC
  Filled 2021-11-07: qty 7, 7d supply, fill #0

## 2021-11-15 ENCOUNTER — Other Ambulatory Visit (HOSPITAL_COMMUNITY): Payer: Self-pay

## 2021-11-15 MED ORDER — BUPRENORPHINE HCL-NALOXONE HCL 8-2 MG SL SUBL
1.0000 | SUBLINGUAL_TABLET | Freq: Every day | SUBLINGUAL | 0 refills | Status: DC
  Filled 2021-11-15: qty 21, 21d supply, fill #0

## 2021-11-29 ENCOUNTER — Encounter: Payer: Self-pay | Admitting: Nurse Practitioner

## 2021-11-29 ENCOUNTER — Other Ambulatory Visit: Payer: Self-pay | Admitting: Nurse Practitioner

## 2021-11-29 ENCOUNTER — Other Ambulatory Visit (HOSPITAL_COMMUNITY): Payer: Self-pay

## 2021-11-29 DIAGNOSIS — F5101 Primary insomnia: Secondary | ICD-10-CM

## 2021-12-03 ENCOUNTER — Encounter: Payer: Self-pay | Admitting: Nurse Practitioner

## 2021-12-03 ENCOUNTER — Other Ambulatory Visit (HOSPITAL_COMMUNITY): Payer: Self-pay

## 2021-12-03 ENCOUNTER — Other Ambulatory Visit: Payer: Self-pay | Admitting: Nurse Practitioner

## 2021-12-03 DIAGNOSIS — F5101 Primary insomnia: Secondary | ICD-10-CM

## 2021-12-03 MED ORDER — ESZOPICLONE 3 MG PO TABS
3.0000 mg | ORAL_TABLET | Freq: Every evening | ORAL | 3 refills | Status: DC | PRN
  Filled 2021-12-03: qty 30, 30d supply, fill #0
  Filled 2022-01-03: qty 30, 30d supply, fill #1
  Filled 2022-01-29: qty 30, 30d supply, fill #2
  Filled 2022-03-01: qty 30, 30d supply, fill #3

## 2021-12-05 ENCOUNTER — Other Ambulatory Visit (HOSPITAL_COMMUNITY): Payer: Self-pay

## 2021-12-05 MED ORDER — HYDROXYZINE HCL 25 MG PO TABS
25.0000 mg | ORAL_TABLET | Freq: Four times a day (QID) | ORAL | 0 refills | Status: DC | PRN
  Filled 2021-12-05: qty 20, 5d supply, fill #0

## 2021-12-05 MED ORDER — LOPERAMIDE HCL 2 MG PO CAPS
2.0000 mg | ORAL_CAPSULE | ORAL | 0 refills | Status: DC
  Filled 2021-12-05: qty 20, 5d supply, fill #0

## 2021-12-05 MED ORDER — CLONIDINE HCL 0.1 MG PO TABS
0.1000 mg | ORAL_TABLET | ORAL | 0 refills | Status: DC
  Filled 2021-12-05: qty 15, 4d supply, fill #0

## 2021-12-06 ENCOUNTER — Other Ambulatory Visit (HOSPITAL_COMMUNITY): Payer: Self-pay

## 2021-12-06 MED ORDER — BUPRENORPHINE HCL-NALOXONE HCL 8-2 MG SL SUBL
1.0000 | SUBLINGUAL_TABLET | Freq: Every day | SUBLINGUAL | 0 refills | Status: DC
  Filled 2021-12-06: qty 28, 28d supply, fill #0

## 2022-01-03 ENCOUNTER — Other Ambulatory Visit (HOSPITAL_COMMUNITY): Payer: Self-pay

## 2022-01-03 MED ORDER — BUPRENORPHINE HCL-NALOXONE HCL 8-2 MG SL SUBL
1.0000 | SUBLINGUAL_TABLET | Freq: Every day | SUBLINGUAL | 0 refills | Status: DC
  Filled 2022-01-03: qty 28, 28d supply, fill #0

## 2022-01-29 ENCOUNTER — Other Ambulatory Visit (HOSPITAL_COMMUNITY): Payer: Self-pay

## 2022-01-30 ENCOUNTER — Other Ambulatory Visit: Payer: Self-pay

## 2022-01-30 ENCOUNTER — Other Ambulatory Visit (HOSPITAL_COMMUNITY): Payer: Self-pay

## 2022-01-30 MED ORDER — BUPRENORPHINE HCL-NALOXONE HCL 8-2 MG SL SUBL
1.0000 | SUBLINGUAL_TABLET | Freq: Every day | SUBLINGUAL | 0 refills | Status: DC
  Filled 2022-01-30: qty 9, 9d supply, fill #0

## 2022-01-31 ENCOUNTER — Ambulatory Visit: Payer: Self-pay | Admitting: Nurse Practitioner

## 2022-02-07 ENCOUNTER — Other Ambulatory Visit (HOSPITAL_COMMUNITY): Payer: Self-pay

## 2022-02-07 MED ORDER — CITALOPRAM HYDROBROMIDE 20 MG PO TABS
40.0000 mg | ORAL_TABLET | Freq: Every day | ORAL | 2 refills | Status: DC
  Filled 2022-02-07 – 2022-03-01 (×2): qty 180, 90d supply, fill #0
  Filled 2022-05-17: qty 180, 90d supply, fill #1
  Filled 2022-10-10: qty 180, 90d supply, fill #2

## 2022-02-07 MED ORDER — BUPRENORPHINE HCL-NALOXONE HCL 8-2 MG SL SUBL
1.0000 | SUBLINGUAL_TABLET | Freq: Every day | SUBLINGUAL | 1 refills | Status: DC
  Filled 2022-02-07: qty 30, 30d supply, fill #0
  Filled 2022-03-08: qty 30, 30d supply, fill #1

## 2022-03-01 ENCOUNTER — Other Ambulatory Visit (HOSPITAL_COMMUNITY): Payer: Self-pay

## 2022-03-08 ENCOUNTER — Other Ambulatory Visit: Payer: Self-pay

## 2022-03-08 ENCOUNTER — Other Ambulatory Visit (HOSPITAL_COMMUNITY): Payer: Self-pay

## 2022-04-05 ENCOUNTER — Encounter: Payer: Self-pay | Admitting: Nurse Practitioner

## 2022-04-05 NOTE — Telephone Encounter (Signed)
LOV 07/26/21 OV 01/31/22 NO SHOW  Last refill 12/03/21 #30, 3 refills  Please advise, thank you!

## 2022-04-08 ENCOUNTER — Other Ambulatory Visit (HOSPITAL_COMMUNITY): Payer: Self-pay

## 2022-04-08 MED ORDER — BUPRENORPHINE HCL-NALOXONE HCL 8-2 MG SL SUBL
1.0000 | SUBLINGUAL_TABLET | Freq: Every day | SUBLINGUAL | 1 refills | Status: AC
  Filled 2022-04-08: qty 9, 9d supply, fill #0

## 2022-04-15 ENCOUNTER — Encounter: Payer: Self-pay | Admitting: Nurse Practitioner

## 2022-04-15 ENCOUNTER — Other Ambulatory Visit (HOSPITAL_COMMUNITY): Payer: Self-pay

## 2022-04-15 ENCOUNTER — Telehealth (INDEPENDENT_AMBULATORY_CARE_PROVIDER_SITE_OTHER): Payer: Self-pay | Admitting: Nurse Practitioner

## 2022-04-15 VITALS — Ht 62.0 in | Wt 195.0 lb

## 2022-04-15 DIAGNOSIS — F5101 Primary insomnia: Secondary | ICD-10-CM

## 2022-04-15 DIAGNOSIS — E6609 Other obesity due to excess calories: Secondary | ICD-10-CM

## 2022-04-15 DIAGNOSIS — D649 Anemia, unspecified: Secondary | ICD-10-CM | POA: Insufficient documentation

## 2022-04-15 DIAGNOSIS — Z01419 Encounter for gynecological examination (general) (routine) without abnormal findings: Secondary | ICD-10-CM

## 2022-04-15 DIAGNOSIS — Z6835 Body mass index (BMI) 35.0-35.9, adult: Secondary | ICD-10-CM

## 2022-04-15 MED ORDER — ESZOPICLONE 3 MG PO TABS
3.0000 mg | ORAL_TABLET | Freq: Every evening | ORAL | 5 refills | Status: DC | PRN
  Filled 2022-04-15: qty 30, 30d supply, fill #0
  Filled 2022-05-17: qty 30, 30d supply, fill #1
  Filled 2022-06-19: qty 30, 30d supply, fill #2
  Filled 2022-07-22: qty 30, 30d supply, fill #3
  Filled 2022-08-22: qty 30, 30d supply, fill #4
  Filled 2022-09-16: qty 30, 30d supply, fill #5

## 2022-04-15 MED ORDER — PHENTERMINE HCL 15 MG PO CAPS
15.0000 mg | ORAL_CAPSULE | ORAL | 0 refills | Status: DC
  Filled 2022-04-15: qty 30, 30d supply, fill #0

## 2022-04-15 NOTE — Progress Notes (Unsigned)
I,Sheena H Holbrook,acting as a Education administrator for Minette Brine, FNP.,have documented all relevant documentation on the behalf of Minette Brine, FNP,as directed by  Minette Brine, FNP while in the presence of Minette Brine, [place].    Subjective:     Patient ID: Kristen David , female    DOB: 1980/03/15 , 42 y.o.   MRN: [id number]   Chief Complaint  Patient presents with   Medication Management    HPI  Patient presents today for refill of Lunesta and Phentermine. She has been out of her lunesta for 2 weeks.   Patient has not taken Phentermine in the past 9 months, she does feel it was working well but never got a refill on it. She has gained back all her weight since not being on Wegovy. She is back on track with her exercising and portion control. Her weight increased to 206 lbs. June 2023 weight was 168 lbs.  Patient would like referral to Gyn, no preferred provider.   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  She continues to take suboxone and is on a 1/2 tab.       Past Medical History:  Diagnosis Date   Anxiety    Depression    Fibroid    Hidradenitis suppurativa    History of narcotic addiction ([place])    Suboxone maintenance treatment complicating pregnancy, antepartum ([facility])    Tachycardia    "bouts of tachycardia" up to 130-140s, sometimes at rest but usually with anxiety   Vaginal Pap smear, abnormal      Family History  Problem Relation Age of Onset   Diabetes Mother    Cancer Mother        Breast   Hypertension Father    Gout Father      Current Outpatient Medications:    buprenorphine-naloxone (SUBOXONE) 8-2 mg SUBL SL tablet, Place 1 tablet under the tongue daily., Disp: 9 tablet, Rfl: 1   citalopram (CELEXA) 20 MG tablet, Take 2 tablets (40 mg total) by mouth daily as directed, Disp: 180 tablet, Rfl: 2   cloNIDine (CATAPRES) 0.1 MG tablet, Take 1 tablet (0.1 mg total) by mouth every 6 to 8 hours as needed, Disp: 15 tablet, Rfl: 0   hydrOXYzine (ATARAX) 25  MG tablet, Take 1 tablet (25 mg total) by mouth 4 (four) times daily as needed for 5 days, Disp: 20 tablet, Rfl: 0   ibuprofen (ADVIL) 600 MG tablet, Take 1 tablet (600 mg total) by mouth every 6 (six) hours., Disp: 30 tablet, Rfl: 0   loperamide (IMODIUM) 2 MG capsule, Take 1 capsule (2 mg total) by mouth every 6 to 8 hours as needed for 5 days, Disp: 20 capsule, Rfl: 0   ondansetron (ZOFRAN-ODT) 4 MG disintegrating tablet, Dissolve 1 tablet (4 mg total) in mouth 4 (four) times daily as needed for 5 days, Disp: 20 tablet, Rfl: 0   phentermine 15 MG capsule, Take 1 capsule (15 mg total) by mouth every morning., Disp: 30 capsule, Rfl: 0   eszopiclone 3 MG TABS, Take 1 tablet (3 mg total) by mouth at bedtime as needed. Take immediately before bedtime, Disp: 30 tablet, Rfl: 5   Allergies  Allergen Reactions   Tramadol Nausea And Vomiting     Review of Systems   Today's Vitals   04/15/22 1259  Weight: 195 lb (88.5 kg)  Height: 5\' 2"  (1.575 m)   Body mass index is 35.67 kg/m.   Objective:  Physical Exam  Assessment And Plan:     1. Primary insomnia Comments: Stable on Lunesta refill sent. - eszopiclone 3 MG TABS; Take 1 tablet (3 mg total) by mouth at bedtime as needed. Take immediately before bedtime  Dispense: 30 tablet; Refill: 5  2. Class 2 obesity due to excess calories without serious comorbidity with body mass index (BMI) of 35.0 to 35.9 in adult Comments: I have explained to her we can do a limited amount of phentermine has been off for several months. Encouraged to increase physical activity and healthy diet - phentermine 15 MG capsule; Take 1 capsule (15 mg total) by mouth every morning.  Dispense: 30 capsule; Refill: 0  3. Encounter for gynecological examination - Ambulatory referral to Obstetrics / Gynecology     Patient was given opportunity to ask questions. Patient verbalized understanding of the plan and was able to repeat key elements of the plan. All  questions were answered to their satisfaction.  Minette Brine, FNP   I, Minette Brine, FNP, have reviewed all documentation for this visit. The documentation on 04/23/22 for the exam, diagnosis, procedures, and orders are all accurate and complete.   IF YOU HAVE BEEN REFERRED TO A SPECIALIST, IT MAY TAKE 1-2 WEEKS TO SCHEDULE/PROCESS THE REFERRAL. IF YOU HAVE NOT HEARD FROM US/SPECIALIST IN TWO WEEKS, PLEASE GIVE [place] A CALL AT [phone].   THE PATIENT IS ENCOURAGED TO PRACTICE SOCIAL DISTANCING DUE TO THE COVID-19 PANDEMIC.

## 2022-04-17 ENCOUNTER — Other Ambulatory Visit (HOSPITAL_COMMUNITY): Payer: Self-pay

## 2022-04-24 ENCOUNTER — Telehealth: Payer: Self-pay | Admitting: Nurse Practitioner

## 2022-04-24 NOTE — Telephone Encounter (Signed)
Called pt to schedule phy no answer left VM

## 2022-05-17 ENCOUNTER — Other Ambulatory Visit (HOSPITAL_COMMUNITY): Payer: Self-pay

## 2022-06-19 ENCOUNTER — Other Ambulatory Visit (HOSPITAL_COMMUNITY): Payer: Self-pay

## 2022-07-22 ENCOUNTER — Other Ambulatory Visit: Payer: Self-pay

## 2022-07-22 ENCOUNTER — Other Ambulatory Visit (HOSPITAL_COMMUNITY): Payer: Self-pay

## 2022-08-22 ENCOUNTER — Other Ambulatory Visit (HOSPITAL_COMMUNITY): Payer: Self-pay

## 2022-08-22 ENCOUNTER — Other Ambulatory Visit: Payer: Self-pay

## 2022-09-16 ENCOUNTER — Other Ambulatory Visit (HOSPITAL_COMMUNITY): Payer: Self-pay

## 2022-10-10 ENCOUNTER — Encounter: Payer: Self-pay | Admitting: Nurse Practitioner

## 2022-10-10 ENCOUNTER — Other Ambulatory Visit: Payer: Self-pay | Admitting: Nurse Practitioner

## 2022-10-10 DIAGNOSIS — F5101 Primary insomnia: Secondary | ICD-10-CM

## 2022-10-11 ENCOUNTER — Other Ambulatory Visit (HOSPITAL_COMMUNITY): Payer: Self-pay

## 2022-10-11 ENCOUNTER — Other Ambulatory Visit: Payer: Self-pay

## 2022-10-11 ENCOUNTER — Other Ambulatory Visit: Payer: Self-pay | Admitting: Nurse Practitioner

## 2022-10-11 DIAGNOSIS — F5101 Primary insomnia: Secondary | ICD-10-CM

## 2022-10-11 MED ORDER — ESZOPICLONE 3 MG PO TABS
3.0000 mg | ORAL_TABLET | Freq: Every evening | ORAL | 0 refills | Status: DC | PRN
  Filled 2022-10-11: qty 30, 30d supply, fill #0

## 2022-10-16 ENCOUNTER — Ambulatory Visit: Payer: Self-pay | Admitting: Family Medicine

## 2022-10-16 ENCOUNTER — Encounter: Payer: Self-pay | Admitting: Family Medicine

## 2022-10-17 ENCOUNTER — Encounter: Payer: Self-pay | Admitting: Family Medicine

## 2022-10-17 ENCOUNTER — Ambulatory Visit: Payer: BC Managed Care – PPO | Admitting: Family Medicine

## 2022-10-17 ENCOUNTER — Other Ambulatory Visit: Payer: Self-pay

## 2022-10-17 ENCOUNTER — Other Ambulatory Visit (HOSPITAL_COMMUNITY): Payer: Self-pay

## 2022-10-17 VITALS — BP 120/64 | HR 90 | Temp 98.7°F | Ht 60.0 in | Wt 160.0 lb

## 2022-10-17 DIAGNOSIS — F5101 Primary insomnia: Secondary | ICD-10-CM | POA: Diagnosis not present

## 2022-10-17 DIAGNOSIS — R7303 Prediabetes: Secondary | ICD-10-CM

## 2022-10-17 DIAGNOSIS — E6609 Other obesity due to excess calories: Secondary | ICD-10-CM | POA: Diagnosis not present

## 2022-10-17 DIAGNOSIS — F411 Generalized anxiety disorder: Secondary | ICD-10-CM | POA: Diagnosis not present

## 2022-10-17 DIAGNOSIS — Z6831 Body mass index (BMI) 31.0-31.9, adult: Secondary | ICD-10-CM

## 2022-10-17 MED ORDER — ESZOPICLONE 3 MG PO TABS: 3.0000 mg | ORAL_TABLET | Freq: Every evening | ORAL | 0 refills | Status: DC | PRN

## 2022-10-17 MED ORDER — CITALOPRAM HYDROBROMIDE 20 MG PO TABS: 20.0000 mg | ORAL_TABLET | Freq: Every day | ORAL | 2 refills | Status: AC

## 2022-10-17 NOTE — Progress Notes (Signed)
I,Jameka J Llittleton, [name],acting as a Neurosurgeon for [employer], NP.,have documented all relevant documentation on the behalf of Ellender Hose, NP,as directed by  Ellender Hose, NP while in the presence of Ellender Hose, NP.  Subjective:  Patient ID: Kristen David , female    DOB: 10-04-1980 , 42 y.o.   MRN: [id number]  Chief Complaint  Patient presents with   Anxiety   Insomnia    HPI  Patient is a 42 year old female who presents today for insomnia follow up. She reports compliance with her meds. She stated she needs  refill on  Citalopram for anxiety and Lunesta for sleep. Patient states anxiety has been well controlled with citalopram daily.       Past Medical History:  Diagnosis Date   Anxiety    Depression    Fibroid    Hidradenitis suppurativa    History of narcotic addiction ([facility])    Suboxone maintenance treatment complicating pregnancy, antepartum ([facility])    Tachycardia    "bouts of tachycardia" up to 130-140s, sometimes at rest but usually with anxiety   Vaginal Pap smear, abnormal      Family History  Problem Relation Age of Onset   Diabetes Mother    Cancer Mother        Breast   Hypertension Father    Gout Father      Current Outpatient Medications:    buprenorphine-naloxone (SUBOXONE) 8-2 mg SUBL SL tablet, Place 1 tablet under the tongue daily., Disp: 9 tablet, Rfl: 1   ondansetron (ZOFRAN-ODT) 4 MG disintegrating tablet, Dissolve 1 tablet (4 mg total) in mouth 4 (four) times daily as needed for 5 days, Disp: 20 tablet, Rfl: 0   citalopram (CELEXA) 20 MG tablet, Take 1 tablet (20 mg total) by mouth daily., Disp: 90 tablet, Rfl: 2   eszopiclone 3 MG TABS, Take 1 tablet (3 mg total) by mouth at bedtime as needed., Disp: 30 tablet, Rfl: 0   [START ON 11/16/2022] eszopiclone 3 MG TABS, Take 1 tablet (3 mg total) by mouth at bedtime as needed., Disp: 30 tablet, Rfl: 0   [START ON 12/17/2022] eszopiclone 3 MG TABS, Take 1 tablet (3 mg total) by mouth at bedtime as  needed., Disp: 30 tablet, Rfl: 0   Allergies  Allergen Reactions   Tramadol Nausea And Vomiting     Review of Systems  Constitutional: Negative.   Eyes: Negative.   Respiratory: Negative.    Cardiovascular: Negative.   Musculoskeletal: Negative.   Skin: Negative.   Psychiatric/Behavioral:  Positive for sleep disturbance. The patient is nervous/anxious.      Today's Vitals   10/17/22 1109  BP: 120/64  Pulse: 90  Temp: 98.7 F (37.1 C)  Weight: 160 lb (72.6 kg)  Height: 5' (1.524 m)  PainSc: 0-No pain   Body mass index is 31.25 kg/m.  Wt Readings from Last 3 Encounters:  10/17/22 160 lb (72.6 kg)  04/15/22 195 lb (88.5 kg)  07/26/21 168 lb (76.2 kg)    The ASCVD Risk score (Arnett DK, et al., 2019) failed to calculate for the following reasons:   Cannot find a previous HDL lab   Cannot find a previous total cholesterol lab  Objective:  Physical Exam Cardiovascular:     Rate and Rhythm: Normal rate and regular rhythm.  Skin:    General: Skin is warm and dry.  Neurological:     General: No focal deficit present.     Mental Status: She is alert  and oriented to person, place, and time.  Psychiatric:        Mood and Affect: Mood normal.         Assessment And Plan:  Primary insomnia -     Eszopiclone; Take 1 tablet (3 mg total) by mouth at bedtime as needed.  Dispense: 30 tablet; Refill: 0 -     Eszopiclone; Take 1 tablet (3 mg total) by mouth at bedtime as needed.  Dispense: 30 tablet; Refill: 0 -     Eszopiclone; Take 1 tablet (3 mg total) by mouth at bedtime as needed.  Dispense: 30 tablet; Refill: 0  Class 1 obesity due to excess calories with body mass index (BMI) of 31.0 to 31.9 in adult, unspecified whether serious comorbidity present  Prediabetes -     Hemoglobin A1c -     CBC -     Basic metabolic panel  Primary insomnia -     Eszopiclone; Take 1 tablet (3 mg total) by mouth at bedtime as needed.  Dispense: 30 tablet; Refill: 0 -     Eszopiclone;  Take 1 tablet (3 mg total) by mouth at bedtime as needed.  Dispense: 30 tablet; Refill: 0 -     Eszopiclone; Take 1 tablet (3 mg total) by mouth at bedtime as needed.  Dispense: 30 tablet; Refill: 0  GAD (generalized anxiety disorder) -     Citalopram Hydrobromide; Take 1 tablet (20 mg total) by mouth daily.  Dispense: 90 tablet; Refill: 2    Return in about 3 months (around 01/16/2023) for insomnia.  Patient was given opportunity to ask questions. Patient verbalized understanding of the plan and was able to repeat key elements of the plan. All questions were answered to their satisfaction.   I, Ellender Hose, NP, have reviewed all documentation for this visit. The documentation on 10/28/22 for the exam, diagnosis, procedures, and orders are all accurate and complete.    IF YOU HAVE BEEN REFERRED TO A SPECIALIST, IT MAY TAKE 1-2 WEEKS TO SCHEDULE/PROCESS THE REFERRAL. IF YOU HAVE NOT HEARD FROM US/SPECIALIST IN TWO WEEKS, PLEASE GIVE [place] A CALL AT [phone].

## 2022-10-18 LAB — CBC
Hematocrit: 39.2 % (ref 34.0–46.6)
Hemoglobin: 11.8 g/dL (ref 11.1–15.9)
MCH: 25.5 pg — ABNORMAL LOW (ref 26.6–33.0)
MCHC: 30.1 g/dL — ABNORMAL LOW (ref 31.5–35.7)
MCV: 85 fL (ref 79–97)
Platelets: 474 10*3/uL — ABNORMAL HIGH (ref 150–450)
RBC: 4.63 x10E6/uL (ref 3.77–5.28)
RDW: 14.4 % (ref 11.7–15.4)
WBC: 5.1 10*3/uL (ref 3.4–10.8)

## 2022-10-18 LAB — HEMOGLOBIN A1C
Est. average glucose Bld gHb Est-mCnc: 117 mg/dL
Hgb A1c MFr Bld: 5.7 % — ABNORMAL HIGH (ref 4.8–5.6)

## 2022-10-18 LAB — BASIC METABOLIC PANEL
BUN/Creatinine Ratio: 8 — ABNORMAL LOW (ref 9–23)
BUN: 7 mg/dL (ref 6–24)
CO2: 26 mmol/L (ref 20–29)
Calcium: 9.1 mg/dL (ref 8.7–10.2)
Chloride: 102 mmol/L (ref 96–106)
Creatinine, Ser: 0.9 mg/dL (ref 0.57–1.00)
Glucose: 83 mg/dL (ref 70–99)
Potassium: 4.5 mmol/L (ref 3.5–5.2)
Sodium: 140 mmol/L (ref 134–144)
eGFR: 82 mL/min/{1.73_m2} (ref >59)

## 2022-10-28 DIAGNOSIS — E66811 Obesity, class 1: Secondary | ICD-10-CM | POA: Insufficient documentation

## 2022-10-28 DIAGNOSIS — E6609 Other obesity due to excess calories: Secondary | ICD-10-CM | POA: Insufficient documentation

## 2022-10-28 DIAGNOSIS — F411 Generalized anxiety disorder: Secondary | ICD-10-CM | POA: Insufficient documentation

## 2022-10-28 DIAGNOSIS — R7303 Prediabetes: Secondary | ICD-10-CM | POA: Insufficient documentation

## 2022-10-28 NOTE — Assessment & Plan Note (Signed)
She is encouraged to strive for BMI less than 30 to decrease cardiac risk. Advised to aim for at least 150 minutes of exercise per week.  

## 2022-10-28 NOTE — Assessment & Plan Note (Signed)
Stable on Lunesta 3mg  at bedtime. Refills sent.

## 2022-10-28 NOTE — Assessment & Plan Note (Signed)
Stable on Celexa 20mg  every day.

## 2022-12-16 ENCOUNTER — Other Ambulatory Visit: Payer: Self-pay | Admitting: Family Medicine

## 2022-12-16 ENCOUNTER — Encounter: Payer: Self-pay | Admitting: Nurse Practitioner

## 2022-12-16 DIAGNOSIS — F5101 Primary insomnia: Secondary | ICD-10-CM

## 2022-12-16 MED ORDER — ESZOPICLONE 3 MG PO TABS: 3.0000 mg | ORAL_TABLET | Freq: Every evening | ORAL | 0 refills | Status: DC | PRN

## 2022-12-17 ENCOUNTER — Other Ambulatory Visit: Payer: Self-pay | Admitting: Family Medicine

## 2022-12-17 ENCOUNTER — Encounter: Payer: Self-pay | Admitting: Nurse Practitioner

## 2022-12-17 DIAGNOSIS — F5101 Primary insomnia: Secondary | ICD-10-CM

## 2022-12-18 ENCOUNTER — Other Ambulatory Visit: Payer: Self-pay | Admitting: Nurse Practitioner

## 2022-12-18 DIAGNOSIS — F5101 Primary insomnia: Secondary | ICD-10-CM

## 2022-12-18 MED ORDER — ESZOPICLONE 3 MG PO TABS: 3.0000 mg | ORAL_TABLET | Freq: Every evening | ORAL | 1 refills | Status: DC | PRN

## 2022-12-31 ENCOUNTER — Encounter: Payer: Self-pay | Admitting: Nurse Practitioner

## 2022-12-31 NOTE — Progress Notes (Unsigned)
Madelaine Bhat, [name],acting as a Neurosurgeon for Arnette Felts, FNP.,have documented all relevant documentation on the behalf of Arnette Felts, FNP,as directed by  Arnette Felts, FNP while in the presence of Arnette Felts, FNP.  Subjective:  Patient ID: Kristen David , female    DOB: 05-24-1980 , 42 y.o.   MRN: [id number]  Chief Complaint  Patient presents with   Vaginal Bleeding    HPI  Patient presents today for a insomnia follow up, Patient reports compliance with medication. Patient denies any chest pain, SOB, or headaches. Patient also states she has been having light vaginal bleeding for 2 weeks. Patient reports her last cycle was 12/04/2022, patient reports the bleeding is not enough to wear a pad its more like discharge. Usually last about 4 days. Then 2 weeks later she saw "pink" when she wiped then would happen regularly. She would have blood tinged underwear would start as old blood then turned pink. Patient reports when she wipes it has blood as well. She reports it started out as dark red then turned pinkish. When she was pregnant they found 3 fibroids. She reports having abnormal cells and had a colposcopy and was good about 3 years ago.  She does not have a GYN.      Past Medical History:  Diagnosis Date   Anxiety    Depression    Fibroid    Hidradenitis suppurativa    History of narcotic addiction (HCC)    Suboxone maintenance treatment complicating pregnancy, antepartum (HCC)    Tachycardia    "bouts of tachycardia" up to 130-140s, sometimes at rest but usually with anxiety   Vaginal Pap smear, abnormal      Family History  Problem Relation Age of Onset   Diabetes Mother    Cancer Mother        Breast   Hypertension Father    Gout Father      Current Outpatient Medications:    buprenorphine-naloxone (SUBOXONE) 8-2 mg SUBL SL tablet, Place 1 tablet under the tongue daily., Disp: 9 tablet, Rfl: 1   citalopram (CELEXA) 20 MG tablet, Take 1 tablet (20 mg total) by  mouth daily., Disp: 90 tablet, Rfl: 2   ondansetron (ZOFRAN-ODT) 4 MG disintegrating tablet, Dissolve 1 tablet (4 mg total) in mouth 4 (four) times daily as needed for 5 days, Disp: 20 tablet, Rfl: 0   eszopiclone 3 MG TABS, Take 1 tablet (3 mg total) by mouth at bedtime as needed., Disp: 30 tablet, Rfl: 5   Allergies  Allergen Reactions   Tramadol Nausea And Vomiting     Review of Systems  Constitutional: Negative.   Eyes: Negative.   Respiratory: Negative.    Cardiovascular: Negative.   Gastrointestinal: Negative.   Musculoskeletal: Negative.   Skin: Negative.   Neurological: Negative.   Psychiatric/Behavioral:  Positive for sleep disturbance. The patient is nervous/anxious.      Today's Vitals   01/01/23 1113  BP: 90/60  Pulse: 87  Temp: 98.1 F (36.7 C)  TempSrc: Oral  Weight: 162 lb (73.5 kg)  Height: 5' (1.524 m)  PainSc: 0-No pain   Body mass index is 31.64 kg/m.  Wt Readings from Last 3 Encounters:  01/01/23 162 lb (73.5 kg)  10/17/22 160 lb (72.6 kg)  04/15/22 195 lb (88.5 kg)    The ASCVD Risk score (Arnett DK, et al., 2019) failed to calculate for the following reasons:   Cannot find a previous HDL lab   Cannot find a previous  total cholesterol lab  Objective:  Physical Exam Vitals reviewed. Exam conducted with a chaperone present.  Constitutional:      General: She is not in acute distress.    Appearance: Normal appearance. She is obese.  Cardiovascular:     Rate and Rhythm: Normal rate and regular rhythm.     Pulses: Normal pulses.     Heart sounds: Normal heart sounds. No murmur heard. Pulmonary:     Effort: Pulmonary effort is normal. No respiratory distress.     Breath sounds: No wheezing.  Abdominal:     General: Abdomen is flat. Bowel sounds are normal. There is no distension.     Palpations: Abdomen is soft. There is no mass.     Tenderness: There is no abdominal tenderness.  Genitourinary:    General: Normal vulva.     Exam position:  Lithotomy position.     Labia:        Right: Lesion present.        Left: Lesion present.      Vagina: Bleeding present.     Cervix: Normal.     Uterus: Normal.      Adnexa: Right adnexa normal and left adnexa normal.     Rectum: Normal. Guaiac result positive (unsure if this is related to vaginal bleeding).     Comments: Currently having an HSV outbreak. She had positive hemocult but not sure this is accurate due to having blood in vaginal vault Skin:    Capillary Refill: Capillary refill takes less than 2 seconds.  Neurological:     General: No focal deficit present.     Mental Status: She is alert and oriented to person, place, and time.     Cranial Nerves: No cranial nerve deficit.     Motor: No weakness.  Psychiatric:        Mood and Affect: Mood normal.        Behavior: Behavior normal.        Thought Content: Thought content normal.        Judgment: Judgment normal.         Assessment And Plan:  Primary insomnia Assessment & Plan: No changes, continue current medications  Orders: -     Eszopiclone; Take 1 tablet (3 mg total) by mouth at bedtime as needed.  Dispense: 30 tablet; Refill: 5  Abnormal vaginal bleeding Assessment & Plan: Blood is present in vaginal vault, will order a transvaginal ultrsound.   Orders: -     Ambulatory referral to Obstetrics / Gynecology -     POCT URINALYSIS DIP (CLINITEK) -     CBC with Differential/Platelet -     US PELVIC COMPLETE WITH TRANSVAGINAL; Future  Rash of genital area Assessment & Plan: Per patient she says she is having an HSV outbreak   COVID-19 vaccination declined Assessment & Plan: Declines covid 19 vaccine. Discussed risk of covid 66 and if she changes her mind about the vaccine to call the office. Education has been provided regarding the importance of this vaccine but patient still declined. Advised may receive this vaccine at local pharmacy or Health Dept.or vaccine clinic. Aware to provide a copy of the  vaccination record if obtained from local pharmacy or Health Dept.  Encouraged to take multivitamin, vitamin d, vitamin c and zinc to increase immune system. Aware can call office if would like to have vaccine here at office. Verbalized acceptance and understanding.    Encounter for Papanicolaou smear of cervix Assessment & Plan:  Cervix is posterior.   Orders: -     Cytology - PAP  Class 1 obesity due to excess calories with body mass index (BMI) of 31.0 to 31.9 in adult, unspecified whether serious comorbidity present Assessment & Plan: She is encouraged to strive for BMI less than 30 to decrease cardiac risk. Advised to aim for at least 150 minutes of exercise per week.     Return in about 4 months (around 05/01/2023) for insomnia, 2 week [place] covid shot. .   Patient was given opportunity to ask questions. Patient verbalized understanding of the plan and was able to repeat key elements of the plan. All questions were answered to their satisfaction.    Jeanell Sparrow, FNP, have reviewed all documentation for this visit. The documentation on 01/01/23 for the exam, diagnosis, procedures, and orders are all accurate and complete.   IF YOU HAVE BEEN REFERRED TO A SPECIALIST, IT MAY TAKE 1-2 WEEKS TO SCHEDULE/PROCESS THE REFERRAL. IF YOU HAVE NOT HEARD FROM US/SPECIALIST IN TWO WEEKS, PLEASE GIVE [place] A CALL AT [phone].

## 2023-01-01 ENCOUNTER — Other Ambulatory Visit (HOSPITAL_COMMUNITY)
Admission: RE | Admit: 2023-01-01 | Discharge: 2023-01-01 | Disposition: A | Discharge status: Home / Self Care | Payer: BC Managed Care – PPO | Source: Ambulatory Visit | Attending: Nurse Practitioner | Admitting: Nurse Practitioner

## 2023-01-01 ENCOUNTER — Encounter: Payer: Self-pay | Admitting: Nurse Practitioner

## 2023-01-01 ENCOUNTER — Ambulatory Visit: Payer: BC Managed Care – PPO | Admitting: Nurse Practitioner

## 2023-01-01 VITALS — BP 90/60 | HR 87 | Temp 98.1°F | Ht 60.0 in | Wt 162.0 lb

## 2023-01-01 DIAGNOSIS — Z2821 Immunization not carried out because of patient refusal: Secondary | ICD-10-CM

## 2023-01-01 DIAGNOSIS — F5101 Primary insomnia: Secondary | ICD-10-CM | POA: Diagnosis not present

## 2023-01-01 DIAGNOSIS — E6609 Other obesity due to excess calories: Secondary | ICD-10-CM

## 2023-01-01 DIAGNOSIS — Z6831 Body mass index (BMI) 31.0-31.9, adult: Secondary | ICD-10-CM

## 2023-01-01 DIAGNOSIS — N939 Abnormal uterine and vaginal bleeding, unspecified: Secondary | ICD-10-CM

## 2023-01-01 DIAGNOSIS — Z124 Encounter for screening for malignant neoplasm of cervix: Secondary | ICD-10-CM

## 2023-01-01 DIAGNOSIS — E66811 Obesity, class 1: Secondary | ICD-10-CM | POA: Diagnosis not present

## 2023-01-01 DIAGNOSIS — R21 Rash and other nonspecific skin eruption: Secondary | ICD-10-CM

## 2023-01-01 MED ORDER — ESZOPICLONE 3 MG PO TABS: 3.0000 mg | ORAL_TABLET | Freq: Every evening | ORAL | 5 refills | Status: DC | PRN

## 2023-01-01 NOTE — Assessment & Plan Note (Signed)
No changes, continue current medications.

## 2023-01-01 NOTE — Assessment & Plan Note (Signed)

## 2023-01-01 NOTE — Assessment & Plan Note (Signed)
Blood is present in vaginal vault, will order a transvaginal ultrsound.

## 2023-01-01 NOTE — Assessment & Plan Note (Signed)
Per patient she says she is having an HSV outbreak

## 2023-01-01 NOTE — Assessment & Plan Note (Signed)
Cervix is posterior.

## 2023-01-01 NOTE — Assessment & Plan Note (Signed)
She is encouraged to strive for BMI less than 30 to decrease cardiac risk. Advised to aim for at least 150 minutes of exercise per week.

## 2023-01-02 LAB — CBC WITH DIFFERENTIAL/PLATELET
Basophils Absolute: 0 10*3/uL (ref 0.0–0.2)
Basos: 0 %
EOS (ABSOLUTE): 0.3 10*3/uL (ref 0.0–0.4)
Eos: 6 %
Hematocrit: 36 % (ref 34.0–46.6)
Hemoglobin: 11.1 g/dL (ref 11.1–15.9)
Immature Grans (Abs): 0 10*3/uL (ref 0.0–0.1)
Immature Granulocytes: 0 %
Lymphocytes Absolute: 1.6 10*3/uL (ref 0.7–3.1)
Lymphs: 32 %
MCH: 26.2 pg — ABNORMAL LOW (ref 26.6–33.0)
MCHC: 30.8 g/dL — ABNORMAL LOW (ref 31.5–35.7)
MCV: 85 fL (ref 79–97)
Monocytes Absolute: 0.3 10*3/uL (ref 0.1–0.9)
Monocytes: 6 %
Neutrophils Absolute: 2.7 10*3/uL (ref 1.4–7.0)
Neutrophils: 56 %
Platelets: 468 10*3/uL — ABNORMAL HIGH (ref 150–450)
RBC: 4.24 x10E6/uL (ref 3.77–5.28)
RDW: 14.8 % (ref 11.7–15.4)
WBC: 4.9 10*3/uL (ref 3.4–10.8)

## 2023-01-10 LAB — CYTOLOGY - PAP: Diagnosis: NEGATIVE

## 2023-01-14 ENCOUNTER — Ambulatory Visit: Payer: BC Managed Care – PPO

## 2023-01-16 ENCOUNTER — Ambulatory Visit: Payer: Self-pay | Admitting: Nurse Practitioner

## 2023-05-01 ENCOUNTER — Ambulatory Visit: Payer: BC Managed Care – PPO | Admitting: Nurse Practitioner

## 2023-05-01 NOTE — Progress Notes (Deleted)
 Madelaine Bhat, [name],acting as a Neurosurgeon for Arnette Felts, FNP.,have documented all relevant documentation on the behalf of Arnette Felts, FNP,as directed by  Arnette Felts, FNP while in the presence of Arnette Felts, FNP.  Subjective:  Patient ID: Kristen David , female    DOB: July 08, 1980 , 43 y.o.   MRN: [id number]  No chief complaint on file.   HPI  Patient presents today for a insomnia follow up, Patient reports compliance with medication. Patient denies any chest pain, SOB, or headaches. Patient has no concerns today.     Past Medical History:  Diagnosis Date  . Anxiety   . Depression   . Fibroid   . Hidradenitis suppurativa   . History of narcotic addiction (HCC)   . Suboxone maintenance treatment complicating pregnancy, antepartum (HCC)   . Tachycardia    "bouts of tachycardia" up to 130-140s, sometimes at rest but usually with anxiety  . Vaginal Pap smear, abnormal      Family History  Problem Relation Age of Onset  . Diabetes Mother   . Cancer Mother        Breast  . Hypertension Father   . Gout Father      Current Outpatient Medications:  .  buprenorphine-naloxone (SUBOXONE) 8-2 mg SUBL SL tablet, Place 1 tablet under the tongue daily., Disp: 9 tablet, Rfl: 1 .  citalopram (CELEXA) 20 MG tablet, Take 1 tablet (20 mg total) by mouth daily., Disp: 90 tablet, Rfl: 2 .  eszopiclone 3 MG TABS, Take 1 tablet (3 mg total) by mouth at bedtime as needed., Disp: 30 tablet, Rfl: 5 .  ondansetron (ZOFRAN-ODT) 4 MG disintegrating tablet, Dissolve 1 tablet (4 mg total) in mouth 4 (four) times daily as needed for 5 days, Disp: 20 tablet, Rfl: 0   Allergies  Allergen Reactions  . Tramadol Nausea And Vomiting     Review of Systems   There were no vitals filed for this visit. There is no height or weight on file to calculate BMI.  Wt Readings from Last 3 Encounters:  01/01/23 162 lb (73.5 kg)  10/17/22 160 lb (72.6 kg)  04/15/22 195 lb (88.5 kg)    The ASCVD Risk score  (Arnett DK, et al., 2019) failed to calculate for the following reasons:   Cannot find a previous HDL lab   Cannot find a previous total cholesterol lab  Objective:  Physical Exam      Assessment And Plan:  Primary insomnia    No follow-ups on file.  Patient was given opportunity to ask questions. Patient verbalized understanding of the plan and was able to repeat key elements of the plan. All questions were answered to their satisfaction.    Jeanell Sparrow, FNP, have reviewed all documentation for this visit. The documentation on 05/01/23 for the exam, diagnosis, procedures, and orders are all accurate and complete.   IF YOU HAVE BEEN REFERRED TO A SPECIALIST, IT MAY TAKE 1-2 WEEKS TO SCHEDULE/PROCESS THE REFERRAL. IF YOU HAVE NOT HEARD FROM US/SPECIALIST IN TWO WEEKS, PLEASE GIVE [place] A CALL AT [phone].

## 2023-07-25 ENCOUNTER — Other Ambulatory Visit: Payer: Self-pay

## 2023-07-25 DIAGNOSIS — F5101 Primary insomnia: Secondary | ICD-10-CM

## 2023-08-20 ENCOUNTER — Encounter: Payer: Self-pay | Admitting: Nurse Practitioner

## 2023-08-20 ENCOUNTER — Ambulatory Visit: Payer: Self-pay | Admitting: Nurse Practitioner

## 2023-08-20 VITALS — BP 110/70 | HR 77 | Temp 98.8°F | Ht 60.0 in | Wt 174.2 lb

## 2023-08-20 DIAGNOSIS — F5101 Primary insomnia: Secondary | ICD-10-CM | POA: Diagnosis not present

## 2023-08-20 DIAGNOSIS — E66811 Obesity, class 1: Secondary | ICD-10-CM | POA: Diagnosis not present

## 2023-08-20 DIAGNOSIS — R7303 Prediabetes: Secondary | ICD-10-CM

## 2023-08-20 DIAGNOSIS — E6609 Other obesity due to excess calories: Secondary | ICD-10-CM

## 2023-08-20 DIAGNOSIS — Z6834 Body mass index (BMI) 34.0-34.9, adult: Secondary | ICD-10-CM

## 2023-08-20 DIAGNOSIS — R220 Localized swelling, mass and lump, head: Secondary | ICD-10-CM | POA: Diagnosis not present

## 2023-08-20 DIAGNOSIS — Z79899 Other long term (current) drug therapy: Secondary | ICD-10-CM

## 2023-08-20 MED ORDER — ESZOPICLONE 3 MG PO TABS: 3.0000 mg | ORAL_TABLET | Freq: Every evening | ORAL | 0 refills | Status: DC | PRN

## 2023-08-20 MED ORDER — ESZOPICLONE 3 MG PO TABS: 3.0000 mg | ORAL_TABLET | Freq: Every evening | ORAL | 5 refills | Status: AC | PRN

## 2023-08-20 NOTE — Progress Notes (Signed)
 LILLETTE Kristeen JINNY Gladis, [name],acting as a Neurosurgeon for Gaines Ada, FNP.,have documented all relevant documentation on the behalf of Gaines Ada, FNP,as directed by  Gaines Ada, FNP while in the presence of Gaines Ada, FNP.  Subjective:  Patient ID: Kristen David , female    DOB: May 25, 1980 , 43 y.o.   MRN: [id number]  Chief Complaint  Patient presents with   Insomnia    Patient presents today for a insomnia follow up, Patient reports compliance with medication. Patient denies any chest pain, SOB, or headaches.    Cyst    Patient has a knot on her forehead, she reports she has had it for about 3 years but it is getting bigger.    HPI Discussed the use of AI scribe software for clinical note transcription with the patient, who gave verbal consent to proceed.  History of Present Illness Kristen David is a 43 year old female who presents for follow-up on insomnia.  Her insomnia remains unchanged. 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 She denies alcohol consumption and states that her anxiety is well-managed with medication.  She has a knot on her head that has been present for about three years and has grown larger over time. It is not painful but has become more prominent, which is concerning to her. She has not had any prior workup for this issue.  She is currently on Suboxone  and is on her last bit of the medication. She has not started any exercise regimen yet.   Insomnia Primary symptoms: sleep disturbance, no frequent awakening.   The symptoms are aggravated by drugs. Typical bedtime:  11-12 P.M..  PMH includes: associated symptoms present, no hypertension, no family stress or anxiety.      Past Medical History:  Diagnosis Date   Anxiety    Depression    Fibroid    Hidradenitis suppurativa    History of narcotic addiction (HCC)    Suboxone   maintenance treatment complicating pregnancy, antepartum (HCC)    Tachycardia    bouts of tachycardia up to 130-140s, sometimes at rest but usually with anxiety   Vaginal Pap smear, abnormal      Family History  Problem Relation Age of Onset   Diabetes Mother    Cancer Mother        Breast   Hypertension Father    Gout Father      Current Outpatient Medications:    buprenorphine -naloxone  (SUBOXONE ) 8-2 mg SUBL SL tablet, Place 1 tablet under the tongue daily., Disp: 9 tablet, Rfl: 1   citalopram  (CELEXA ) 20 MG tablet, Take 1 tablet (20 mg total) by mouth daily., Disp: 90 tablet, Rfl: 2   ondansetron  (ZOFRAN -ODT) 4 MG disintegrating tablet, Dissolve 1 tablet (4 mg total) in mouth 4 (four) times daily as needed for 5 days, Disp: 20 tablet, Rfl: 0   eszopiclone  3 MG TABS, Take 1 tablet (3 mg total) by mouth at bedtime as needed., Disp: 30 tablet, Rfl: 5   Allergies  Allergen Reactions   Tramadol  Nausea And Vomiting     Review of Systems  Constitutional: Negative.   Eyes: Negative.   Respiratory: Negative.    Cardiovascular: Negative.   Gastrointestinal: Negative.   Musculoskeletal: Negative.   Skin: Negative.   Neurological: Negative.   Psychiatric/Behavioral:  Positive for sleep disturbance. The patient is nervous/anxious and has  insomnia.      Today's Vitals   08/20/23 1204  BP: 110/70  Pulse: 77  Temp: 98.8 F (37.1 C)  TempSrc: Oral  Weight: 174 lb 3.2 oz (79 kg)  Height: 5' (1.524 m)  PainSc: 0-No pain   Body mass index is 34.02 kg/m.  Wt Readings from Last 3 Encounters:  08/20/23 174 lb 3.2 oz (79 kg)  01/01/23 162 lb (73.5 kg)  10/17/22 160 lb (72.6 kg)      Objective:  Physical Exam Vitals and nursing note reviewed.  Constitutional:      General: She is not in acute distress.    Appearance: Normal appearance. She is obese.  Cardiovascular:     Rate and Rhythm: Normal rate and regular rhythm.     Pulses: Normal pulses.     Heart sounds: Normal  heart sounds. No murmur heard. Pulmonary:     Effort: Pulmonary effort is normal. No respiratory distress.     Breath sounds: No wheezing.  Skin:    Capillary Refill: Capillary refill takes less than 2 seconds.  Neurological:     General: No focal deficit present.     Mental Status: She is alert and oriented to person, place, and time.     Cranial Nerves: No cranial nerve deficit.     Motor: No weakness.  Psychiatric:        Mood and Affect: Mood normal.        Behavior: Behavior normal.        Thought Content: Thought content normal.        Judgment: Judgment normal.         Assessment And Plan:  Primary insomnia Assessment & Plan: Chronic insomnia with no symptom changes. Irregular sleep schedule due to night shifts.  Orders: -     Eszopiclone ; Take 1 tablet (3 mg total) by mouth at bedtime as needed.  Dispense: 30 tablet; Refill: 5  Prediabetes Assessment & Plan: Continue focusing on healthy diet low in sugar and starches/carbohydrates  Orders: -     BMP8+eGFR -     Hemoglobin A1c  Nodule of skin of head Assessment & Plan: Non-painful, growing mass for three years. Ultrasound needed to determine nature before specialist referral. - Order soft tissue head and neck ultrasound with attention to the left side. - Consider referral to general surgeon or dermatologist based on ultrasound results.  Orders: -     US  SOFT TISSUE HEAD & NECK (NON-THYROID ); Future -     CBC with Differential/Platelet  Class 1 obesity due to excess calories with body mass index (BMI) of 34.0 to 34.9 in adult, unspecified whether serious comorbidity present Assessment & Plan: She is encouraged to strive for BMI less than 30 to decrease cardiac risk. Advised to aim for at least 150 minutes of exercise per week.    Other long term (current) drug therapy -     BMP8+eGFR     Return in about 3 months (around 11/20/2023) for insomnia; HM.  Patient was given opportunity to ask questions.  Patient verbalized understanding of the plan and was able to repeat key elements of the plan. All questions were answered to their satisfaction.    LILLETTE Gaines Ada, FNP, have reviewed all documentation for this visit. The documentation on 08/20/23 for the exam, diagnosis, procedures, and orders are all accurate and complete.   IF YOU HAVE BEEN REFERRED TO A SPECIALIST, IT MAY TAKE 1-2 WEEKS TO SCHEDULE/PROCESS THE REFERRAL. IF YOU HAVE NOT  HEARD FROM US /SPECIALIST IN TWO WEEKS, PLEASE GIVE US  A CALL AT [phone].

## 2023-08-21 LAB — CBC WITH DIFFERENTIAL/PLATELET
Basophils Absolute: 0 x10E3/uL (ref 0.0–0.2)
Basos: 0 %
EOS (ABSOLUTE): 0.3 x10E3/uL (ref 0.0–0.4)
Eos: 7 %
Hematocrit: 38 % (ref 34.0–46.6)
Hemoglobin: 11.8 g/dL (ref 11.1–15.9)
Immature Grans (Abs): 0 x10E3/uL (ref 0.0–0.1)
Immature Granulocytes: 0 %
Lymphocytes Absolute: 1.5 x10E3/uL (ref 0.7–3.1)
Lymphs: 36 %
MCH: 26.5 pg — ABNORMAL LOW (ref 26.6–33.0)
MCHC: 31.1 g/dL — ABNORMAL LOW (ref 31.5–35.7)
MCV: 85 fL (ref 79–97)
Monocytes Absolute: 0.3 x10E3/uL (ref 0.1–0.9)
Monocytes: 8 %
Neutrophils Absolute: 2.1 x10E3/uL (ref 1.4–7.0)
Neutrophils: 49 %
Platelets: 395 x10E3/uL (ref 150–450)
RBC: 4.45 x10E6/uL (ref 3.77–5.28)
RDW: 14.1 % (ref 11.7–15.4)
WBC: 4.3 x10E3/uL (ref 3.4–10.8)

## 2023-08-21 LAB — BMP8+EGFR
BUN/Creatinine Ratio: 9 (ref 9–23)
BUN: 8 mg/dL (ref 6–24)
CO2: 22 mmol/L (ref 20–29)
Calcium: 8.9 mg/dL (ref 8.7–10.2)
Chloride: 100 mmol/L (ref 96–106)
Creatinine, Ser: 0.94 mg/dL (ref 0.57–1.00)
Glucose: 74 mg/dL (ref 70–99)
Potassium: 4.2 mmol/L (ref 3.5–5.2)
Sodium: 139 mmol/L (ref 134–144)
eGFR: 78 mL/min/1.73 (ref >59)

## 2023-08-21 LAB — HEMOGLOBIN A1C
Est. average glucose Bld gHb Est-mCnc: 114 mg/dL
Hgb A1c MFr Bld: 5.6 % (ref 4.8–5.6)

## 2023-08-27 ENCOUNTER — Inpatient Hospital Stay
Admission: RE | Admit: 2023-08-27 | Discharge: 2023-08-27 | Discharge status: Home / Self Care | Source: Ambulatory Visit | Attending: Nurse Practitioner | Admitting: Nurse Practitioner

## 2023-08-27 DIAGNOSIS — R220 Localized swelling, mass and lump, head: Secondary | ICD-10-CM

## 2023-08-31 ENCOUNTER — Ambulatory Visit: Payer: Self-pay | Admitting: Nurse Practitioner

## 2023-08-31 DIAGNOSIS — R220 Localized swelling, mass and lump, head: Secondary | ICD-10-CM | POA: Insufficient documentation

## 2023-08-31 NOTE — Assessment & Plan Note (Signed)
 Continue focusing on healthy diet low in sugar and starches/carbohydrates

## 2023-08-31 NOTE — Assessment & Plan Note (Signed)
 She is encouraged to strive for BMI less than 30 to decrease cardiac risk. Advised to aim for at least 150 minutes of exercise per week.

## 2023-08-31 NOTE — Assessment & Plan Note (Signed)
 Non-painful, growing mass for three years. Ultrasound needed to determine nature before specialist referral. - Order soft tissue head and neck ultrasound with attention to the left side. - Consider referral to general surgeon or dermatologist based on ultrasound results.

## 2023-08-31 NOTE — Assessment & Plan Note (Signed)
 Chronic insomnia with no symptom changes. Irregular sleep schedule due to night shifts.

## 2023-09-18 ENCOUNTER — Other Ambulatory Visit: Payer: Self-pay | Admitting: Nurse Practitioner

## 2023-09-18 DIAGNOSIS — R220 Localized swelling, mass and lump, head: Secondary | ICD-10-CM

## 2023-10-02 ENCOUNTER — Ambulatory Visit
Admission: RE | Admit: 2023-10-02 | Discharge: 2023-10-02 | Disposition: A | Discharge status: Home / Self Care | Source: Ambulatory Visit | Attending: Nurse Practitioner | Admitting: Nurse Practitioner

## 2023-10-02 DIAGNOSIS — R220 Localized swelling, mass and lump, head: Secondary | ICD-10-CM

## 2023-11-11 ENCOUNTER — Ambulatory Visit: Payer: Self-pay | Admitting: Nurse Practitioner

## 2023-12-30 ENCOUNTER — Encounter: Payer: Self-pay | Admitting: Nurse Practitioner
